# Patient Record
Sex: Female | Born: 1945 | Race: White | Hispanic: No | Marital: Married | Smoking: Never smoker
Health system: Southern US, Community
[De-identification: ages and names within clinical notes are randomized; demographics above are authoritative.]

## PROBLEM LIST (undated history)

## (undated) DIAGNOSIS — D126 Benign neoplasm of colon, unspecified: Secondary | ICD-10-CM

## (undated) DIAGNOSIS — M5136 Other intervertebral disc degeneration, lumbar region: Secondary | ICD-10-CM

## (undated) DIAGNOSIS — I10 Essential (primary) hypertension: Secondary | ICD-10-CM

## (undated) DIAGNOSIS — K219 Gastro-esophageal reflux disease without esophagitis: Secondary | ICD-10-CM

## (undated) DIAGNOSIS — R7303 Prediabetes: Secondary | ICD-10-CM

## (undated) DIAGNOSIS — M1990 Unspecified osteoarthritis, unspecified site: Secondary | ICD-10-CM

## (undated) DIAGNOSIS — M79605 Pain in left leg: Secondary | ICD-10-CM

## (undated) DIAGNOSIS — R519 Headache, unspecified: Secondary | ICD-10-CM

## (undated) DIAGNOSIS — E119 Type 2 diabetes mellitus without complications: Secondary | ICD-10-CM

## (undated) DIAGNOSIS — H3320 Serous retinal detachment, unspecified eye: Secondary | ICD-10-CM

## (undated) DIAGNOSIS — I2510 Atherosclerotic heart disease of native coronary artery without angina pectoris: Secondary | ICD-10-CM

## (undated) DIAGNOSIS — E785 Hyperlipidemia, unspecified: Secondary | ICD-10-CM

## (undated) DIAGNOSIS — F419 Anxiety disorder, unspecified: Secondary | ICD-10-CM

## (undated) DIAGNOSIS — F32A Depression, unspecified: Secondary | ICD-10-CM

## (undated) DIAGNOSIS — F329 Major depressive disorder, single episode, unspecified: Secondary | ICD-10-CM

## (undated) DIAGNOSIS — L4050 Arthropathic psoriasis, unspecified: Secondary | ICD-10-CM

## (undated) DIAGNOSIS — M51369 Other intervertebral disc degeneration, lumbar region without mention of lumbar back pain or lower extremity pain: Secondary | ICD-10-CM

## (undated) DIAGNOSIS — N9489 Other specified conditions associated with female genital organs and menstrual cycle: Secondary | ICD-10-CM

## (undated) HISTORY — PX: ABDOMINAL HYSTERECTOMY: SHX81

## (undated) HISTORY — PX: FLEXIBLE SIGMOIDOSCOPY: SHX1649

## (undated) HISTORY — DX: Hyperlipidemia, unspecified: E78.5

## (undated) HISTORY — PX: APPENDECTOMY: SHX54

## (undated) HISTORY — PX: LUMBAR LAMINECTOMY: SHX95

## (undated) HISTORY — PX: BILATERAL CARPAL TUNNEL RELEASE: SHX6508

## (undated) HISTORY — PX: TRIGGER FINGER RELEASE: SHX641

## (undated) HISTORY — PX: RETINAL DETACHMENT SURGERY: SHX105

## (undated) HISTORY — PX: ANGIOPLASTY: SHX39

## (undated) HISTORY — PX: CHOLECYSTECTOMY: SHX55

## (undated) HISTORY — DX: Gastro-esophageal reflux disease without esophagitis: K21.9

## (undated) HISTORY — PX: CATARACT EXTRACTION: SUR2

## (undated) HISTORY — PX: EYE SURGERY: SHX253

## (undated) HISTORY — DX: Essential (primary) hypertension: I10

## (undated) HISTORY — PX: BREAST BIOPSY: SHX20

## (undated) HISTORY — PX: COLONOSCOPY: SHX5424

## (undated) HISTORY — DX: Unspecified osteoarthritis, unspecified site: M19.90

---

## 2001-03-27 HISTORY — PX: JOINT REPLACEMENT: SHX530

## 2004-07-26 ENCOUNTER — Ambulatory Visit: Payer: Self-pay | Admitting: Internal Medicine

## 2005-07-12 ENCOUNTER — Other Ambulatory Visit: Payer: Self-pay

## 2005-07-20 ENCOUNTER — Ambulatory Visit: Payer: Self-pay | Admitting: Otolaryngology

## 2005-09-26 ENCOUNTER — Ambulatory Visit: Payer: Self-pay | Admitting: Internal Medicine

## 2005-11-15 ENCOUNTER — Ambulatory Visit: Payer: Self-pay | Admitting: Internal Medicine

## 2006-12-06 ENCOUNTER — Ambulatory Visit: Payer: Self-pay | Admitting: Internal Medicine

## 2007-04-16 ENCOUNTER — Ambulatory Visit: Payer: Self-pay | Admitting: Unknown Physician Specialty

## 2007-04-16 ENCOUNTER — Other Ambulatory Visit: Payer: Self-pay

## 2007-04-23 ENCOUNTER — Inpatient Hospital Stay: Payer: Self-pay | Admitting: Unknown Physician Specialty

## 2008-01-07 ENCOUNTER — Ambulatory Visit: Payer: Self-pay | Admitting: Internal Medicine

## 2009-01-19 ENCOUNTER — Ambulatory Visit: Payer: Self-pay | Admitting: Internal Medicine

## 2010-01-20 ENCOUNTER — Emergency Department: Payer: Self-pay | Admitting: Unknown Physician Specialty

## 2010-01-25 ENCOUNTER — Ambulatory Visit: Payer: Self-pay | Admitting: Internal Medicine

## 2010-01-28 ENCOUNTER — Ambulatory Visit: Payer: Self-pay | Admitting: Surgery

## 2010-02-07 ENCOUNTER — Ambulatory Visit: Payer: Self-pay | Admitting: Anesthesiology

## 2010-02-08 ENCOUNTER — Ambulatory Visit: Payer: Self-pay | Admitting: Surgery

## 2010-02-28 ENCOUNTER — Ambulatory Visit: Payer: Self-pay | Admitting: Internal Medicine

## 2011-01-30 ENCOUNTER — Ambulatory Visit: Payer: Self-pay | Admitting: Family Medicine

## 2011-05-12 ENCOUNTER — Ambulatory Visit: Payer: Self-pay | Admitting: Rheumatology

## 2011-06-06 ENCOUNTER — Ambulatory Visit: Payer: Self-pay | Admitting: Family Medicine

## 2012-04-10 ENCOUNTER — Ambulatory Visit: Payer: Self-pay | Admitting: Family Medicine

## 2013-03-27 HISTORY — PX: BACK SURGERY: SHX140

## 2013-05-13 ENCOUNTER — Ambulatory Visit: Payer: Self-pay | Admitting: Family Medicine

## 2013-06-03 ENCOUNTER — Ambulatory Visit: Payer: Self-pay | Admitting: Family Medicine

## 2014-01-09 ENCOUNTER — Ambulatory Visit: Payer: Self-pay | Admitting: Physical Medicine and Rehabilitation

## 2014-06-11 ENCOUNTER — Ambulatory Visit: Payer: Self-pay | Admitting: Family Medicine

## 2014-06-17 ENCOUNTER — Ambulatory Visit: Payer: Self-pay | Admitting: Family Medicine

## 2015-06-21 ENCOUNTER — Other Ambulatory Visit: Payer: Self-pay | Admitting: Family Medicine

## 2015-06-21 DIAGNOSIS — Z1231 Encounter for screening mammogram for malignant neoplasm of breast: Secondary | ICD-10-CM

## 2015-06-29 ENCOUNTER — Ambulatory Visit
Admission: RE | Admit: 2015-06-29 | Discharge: 2015-06-29 | Disposition: A | Discharge status: Home / Self Care | Payer: Medicare Other | Source: Ambulatory Visit | Attending: Family Medicine | Admitting: Family Medicine

## 2015-06-29 ENCOUNTER — Other Ambulatory Visit: Payer: Self-pay | Admitting: Family Medicine

## 2015-06-29 DIAGNOSIS — Z1231 Encounter for screening mammogram for malignant neoplasm of breast: Secondary | ICD-10-CM

## 2016-05-24 ENCOUNTER — Ambulatory Visit: Payer: Medicare Other | Admitting: *Deleted

## 2016-05-29 ENCOUNTER — Other Ambulatory Visit: Payer: Self-pay | Admitting: Family Medicine

## 2016-05-29 DIAGNOSIS — Z1231 Encounter for screening mammogram for malignant neoplasm of breast: Secondary | ICD-10-CM

## 2016-06-01 ENCOUNTER — Encounter: Payer: Self-pay | Admitting: *Deleted

## 2016-06-01 ENCOUNTER — Encounter: Payer: Medicare Other | Attending: Family Medicine | Admitting: *Deleted

## 2016-06-01 VITALS — BP 126/76 | Ht 64.0 in | Wt 165.6 lb

## 2016-06-01 DIAGNOSIS — E119 Type 2 diabetes mellitus without complications: Secondary | ICD-10-CM | POA: Insufficient documentation

## 2016-06-01 DIAGNOSIS — Z713 Dietary counseling and surveillance: Secondary | ICD-10-CM | POA: Insufficient documentation

## 2016-06-01 NOTE — Progress Notes (Signed)
Diabetes Self-Management Education  Visit Type: First/Initial  Appt. Start Time: 1325 Appt. End Time: 4401  06/01/2016  [name], identified by name and date of birth, is a 71 y.o. female with a diagnosis of Diabetes: Type 2.   ASSESSMENT  Blood pressure 126/76, height 5\' 4"  (1.626 m), weight 165 lb 9.6 oz (75.1 kg). Body mass index is 28.43 kg/m.      Diabetes Self-Management Education - 06/01/16 1525      Visit Information   Visit Type First/Initial     Initial Visit   Diabetes Type Type 2   Are you currently following a meal plan? No   Are you taking your medications as prescribed? Yes   Date Diagnosed Jan 2018     Health Coping   How would you rate your overall health? Good     Psychosocial Assessment   Patient Belief/Attitude about Diabetes Motivated to manage diabetes   Self-care barriers None   Self-management support Doctor's office;Family   Patient Concerns Nutrition/Meal planning;Glycemic Control;Weight Control;Healthy Lifestyle   Special Needs None   Preferred Learning Style Visual;Hands on   Learning Readiness Ready   How often do you need to have someone help you when you read instructions, pamphlets, or other written materials from your doctor or pharmacy? 1 - Never   What is the last grade level you completed in school? 12th     Pre-Education Assessment   Patient understands the diabetes disease and treatment process. Needs Instruction   Patient understands incorporating nutritional management into lifestyle. Needs Instruction   Patient undertands incorporating physical activity into lifestyle. Needs Instruction   Patient understands using medications safely. Needs Instruction   Patient understands monitoring blood glucose, interpreting and using results Needs Instruction   Patient understands prevention, detection, and treatment of acute complications. Needs Instruction   Patient understands prevention, detection, and treatment of chronic  complications. Needs Instruction   Patient understands how to develop strategies to address psychosocial issues. Needs Instruction   Patient understands how to develop strategies to promote health/change behavior. Needs Instruction     Complications   Last HgB A1C per patient/outside source 6.5 %  04/26/16   How often do you check your blood sugar? 0 times/day (not testing)  Pt brought her meter to appt. Instructed on GE 100 glucometer. BG upon return demonstration was 106 mg/dL at 2:30 pm - 2 hrs pp.    Have you had a dilated eye exam in the past 12 months? Yes   Have you had a dental exam in the past 12 months? Yes   Are you checking your feet? No     Dietary Intake   Breakfast cereal and milk; toast and butter   Lunch bologna sandwich or tomato sandwich with chips   Snack (afternoon) cheese, grapes   Dinner meat, potatoes, peas, corn, beans, cabbage, greens   Beverage(s) water, unsweetened tea, diet soda     Exercise   Exercise Type ADL's     Patient Education   Previous Diabetes Education No   Disease state  Definition of diabetes, type 1 and 2, and the diagnosis of diabetes   Nutrition management  Role of diet in the treatment of diabetes and the relationship between the three main macronutrients and blood glucose level;Food label reading, portion sizes and measuring food.   Physical activity and exercise  Role of exercise on diabetes management, blood pressure control and cardiac health.   Monitoring Taught/evaluated SMBG meter.;Purpose and frequency of SMBG.;Taught/discussed recording  of test results and interpretation of SMBG.;Identified appropriate SMBG and/or A1C goals.   Chronic complications Relationship between chronic complications and blood glucose control   Psychosocial adjustment Identified and addressed patients feelings and concerns about diabetes     Individualized Goals (developed by patient)   Reducing Risk Improve blood sugars Lose weight Lead a healthier  lifestyle Become more fit     Outcomes   Expected Outcomes Demonstrated interest in learning. Expect positive outcomes   Future DMSE 2 wks      Individualized Plan for Diabetes Self-Management Training:   Learning Objective:  Patient will have a greater understanding of diabetes self-management. Patient education plan is to attend individual and/or group sessions per assessed needs and concerns.   Plan:   Patient Instructions  Check blood sugars 2 x day before breakfast and 2 hrs after one meal 3-4 x week Exercise: Begin walking for 15  minutes  3  days a week and gradually increase to 30 minutes 5 x week Eat 3 meals day,  1-2 snacks a day Space meals 4-6 hours apart Limit snack foods (chips) Bring blood sugar records to the next class   Expected Outcomes:  Demonstrated interest in learning. Expect positive outcomes  Education material provided:  General Meal Planning Guidelines Simple Meal Plan  If problems or questions, patient to contact team via:  Tina Drilling, RN, [place], CDE [phone]  Future DSME appointment: 2 wks  June 22, 2016 for Diabetes Class 1

## 2016-06-01 NOTE — Patient Instructions (Signed)
Check blood sugars 2 x day before breakfast and 2 hrs after one meal 3-4 x week  Exercise: Begin walking for 15  minutes  3  days a week and gradually increase to 30 minutes 5 x week  Eat 3 meals day,  1-2 snacks a day Space meals 4-6 hours apart Limit snack foods (chips)  Bring blood sugar records to the next class

## 2016-06-22 ENCOUNTER — Encounter: Payer: Medicare Other | Admitting: Dietician

## 2016-06-22 ENCOUNTER — Encounter: Payer: Self-pay | Admitting: Dietician

## 2016-06-22 VITALS — Wt 166.1 lb

## 2016-06-22 DIAGNOSIS — Z713 Dietary counseling and surveillance: Secondary | ICD-10-CM | POA: Diagnosis not present

## 2016-06-22 DIAGNOSIS — E119 Type 2 diabetes mellitus without complications: Secondary | ICD-10-CM

## 2016-06-22 NOTE — Progress Notes (Signed)

## 2016-06-29 ENCOUNTER — Other Ambulatory Visit: Payer: Self-pay | Admitting: Family Medicine

## 2016-06-29 DIAGNOSIS — M25511 Pain in right shoulder: Secondary | ICD-10-CM

## 2016-07-03 ENCOUNTER — Telehealth: Payer: Self-pay | Admitting: *Deleted

## 2016-07-03 NOTE — Telephone Encounter (Signed)
Received voice mail from patient. She needs to ask about diabetes class schedule since she missed last week's Class 2. Called patient and she reports that she saw the doctor for pain in her legs and was given Prednisone. She also had xrays taken. If pain still present after steroids she may see an orthopedic. She will come to Class 3 this Thurs and reschedule Class 2 at this time.

## 2016-07-04 ENCOUNTER — Ambulatory Visit: Payer: Medicare Other

## 2016-07-12 ENCOUNTER — Ambulatory Visit: Payer: Medicare Other

## 2016-07-14 ENCOUNTER — Ambulatory Visit
Admission: RE | Admit: 2016-07-14 | Discharge: 2016-07-14 | Disposition: A | Discharge status: Home / Self Care | Payer: Medicare Other | Source: Ambulatory Visit | Attending: Family Medicine | Admitting: Family Medicine

## 2016-07-14 DIAGNOSIS — S46811A Strain of other muscles, fascia and tendons at shoulder and upper arm level, right arm, initial encounter: Secondary | ICD-10-CM | POA: Diagnosis not present

## 2016-07-14 DIAGNOSIS — M19011 Primary osteoarthritis, right shoulder: Secondary | ICD-10-CM | POA: Diagnosis not present

## 2016-07-14 DIAGNOSIS — M75111 Incomplete rotator cuff tear or rupture of right shoulder, not specified as traumatic: Secondary | ICD-10-CM | POA: Insufficient documentation

## 2016-07-14 DIAGNOSIS — M25511 Pain in right shoulder: Secondary | ICD-10-CM | POA: Insufficient documentation

## 2016-07-14 DIAGNOSIS — X58XXXA Exposure to other specified factors, initial encounter: Secondary | ICD-10-CM | POA: Insufficient documentation

## 2016-07-24 ENCOUNTER — Ambulatory Visit
Admission: RE | Admit: 2016-07-24 | Discharge: 2016-07-24 | Disposition: A | Discharge status: Home / Self Care | Payer: Medicare Other | Source: Ambulatory Visit | Attending: Family Medicine | Admitting: Family Medicine

## 2016-07-24 DIAGNOSIS — Z1231 Encounter for screening mammogram for malignant neoplasm of breast: Secondary | ICD-10-CM

## 2016-08-02 ENCOUNTER — Other Ambulatory Visit: Payer: Self-pay | Admitting: Physician Assistant

## 2016-08-02 DIAGNOSIS — M4326 Fusion of spine, lumbar region: Secondary | ICD-10-CM

## 2016-08-02 DIAGNOSIS — M79605 Pain in left leg: Secondary | ICD-10-CM

## 2016-08-02 DIAGNOSIS — R202 Paresthesia of skin: Secondary | ICD-10-CM

## 2016-08-03 ENCOUNTER — Encounter: Payer: Self-pay | Admitting: *Deleted

## 2016-08-07 ENCOUNTER — Ambulatory Visit
Admission: RE | Admit: 2016-08-07 | Discharge: 2016-08-07 | Disposition: A | Discharge status: Home / Self Care | Payer: Medicare Other | Source: Ambulatory Visit | Attending: Physician Assistant | Admitting: Physician Assistant

## 2016-08-07 DIAGNOSIS — M4326 Fusion of spine, lumbar region: Secondary | ICD-10-CM

## 2016-08-07 DIAGNOSIS — M5126 Other intervertebral disc displacement, lumbar region: Secondary | ICD-10-CM | POA: Diagnosis not present

## 2016-08-07 DIAGNOSIS — M79605 Pain in left leg: Secondary | ICD-10-CM | POA: Diagnosis present

## 2016-08-07 DIAGNOSIS — R202 Paresthesia of skin: Secondary | ICD-10-CM

## 2016-08-07 DIAGNOSIS — M48061 Spinal stenosis, lumbar region without neurogenic claudication: Secondary | ICD-10-CM | POA: Diagnosis not present

## 2016-09-12 ENCOUNTER — Encounter
Admission: RE | Admit: 2016-09-12 | Discharge: 2016-09-12 | Disposition: A | Discharge status: Home / Self Care | Payer: Medicare Other | Source: Ambulatory Visit | Attending: Surgery | Admitting: Surgery

## 2016-09-12 DIAGNOSIS — Z01810 Encounter for preprocedural cardiovascular examination: Secondary | ICD-10-CM | POA: Insufficient documentation

## 2016-09-12 DIAGNOSIS — I10 Essential (primary) hypertension: Secondary | ICD-10-CM | POA: Diagnosis present

## 2016-09-12 DIAGNOSIS — Z01812 Encounter for preprocedural laboratory examination: Secondary | ICD-10-CM | POA: Diagnosis present

## 2016-09-12 HISTORY — DX: Anxiety disorder, unspecified: F41.9

## 2016-09-12 HISTORY — DX: Prediabetes: R73.03

## 2016-09-12 HISTORY — DX: Major depressive disorder, single episode, unspecified: F32.9

## 2016-09-12 HISTORY — DX: Depression, unspecified: F32.A

## 2016-09-12 HISTORY — DX: Unspecified osteoarthritis, unspecified site: M19.90

## 2016-09-12 HISTORY — DX: Other intervertebral disc degeneration, lumbar region without mention of lumbar back pain or lower extremity pain: M51.369

## 2016-09-12 HISTORY — DX: Other intervertebral disc degeneration, lumbar region: M51.36

## 2016-09-12 LAB — CBC
HCT: 38.1 % (ref 35.0–47.0)
Hemoglobin: 13.2 g/dL (ref 12.0–16.0)
MCH: 29.9 pg (ref 26.0–34.0)
MCHC: 34.6 g/dL (ref 32.0–36.0)
MCV: 86.5 fL (ref 80.0–100.0)
PLATELETS: 309 10*3/uL (ref 150–440)
RBC: 4.41 MIL/uL (ref 3.80–5.20)
RDW: 13.5 % (ref 11.5–14.5)
WBC: 6.1 10*3/uL (ref 3.6–11.0)

## 2016-09-12 NOTE — Patient Instructions (Signed)
  Your procedure is scheduled on: September 19, 2016 [facility]) Report to Same Day Surgery 2nd floor medical mall ([place] Entrance-take elevator on left to 2nd floor.  Check in with surgery information desk.) To find out your arrival time please call [phone] between 1PM - 3PM on September 18, 2016 (MONDAY)   Remember: Instructions that are not followed completely may result in serious medical risk, up to and including death, or upon the discretion of your surgeon and anesthesiologist your surgery may need to be rescheduled.    _x___ 1. Do not eat food or drink liquids after midnight. No gum chewing or  hard candies                              __x__ 2. No Alcohol for 24 hours before or after surgery.   __x__3. No Smoking for 24 prior to surgery.   ____  4. Bring all medications with you on the day of surgery if instructed.    __x__ 5. Notify your doctor if there is any change in your medical condition     (cold, fever, infections).     Do not wear jewelry, make-up, hairpins, clips or nail polish.  Do not wear lotions, powders, or perfumes.  Do not shave 48 hours prior to surgery. Men may shave face and neck.  Do not bring valuables to the hospital.    [facility] is not responsible for any belongings or valuables.               Contacts, dentures or bridgework may not be worn into surgery.  Leave your suitcase in the car. After surgery it may be brought to your room.  For patients admitted to the hospital, discharge time is determined by your  treatment team                       Patients discharged the day of surgery will not be allowed to drive home.  You will need someone to drive you home and stay with you the night of your procedure.    Please read over the following fact sheets that you were given:   [facility] Preparing for Surgery and or MRSA Information   _x___ Take the following medications the morning of surgery with a sip of water :  1. ATORVASTATIN  2.  GABAPENTIN  3. CYMBALTA  4. AMLODIPINE  5. OMEPRAZOLE (OMEPRAZOLE AT BEDTIME ON MONDAY NIGHT PRIOR TO SURGERY )  6.  ____Fleets enema or Magnesium Citrate as directed.   _x___ Use CHG Soap or sage wipes as directed on instruction sheet   ____ Use inhalers on the day of surgery and bring to hospital day of surgery  ____ Stop Metformin and Janumet 2 days prior to surgery.    ____ Take 1/2 of usual insulin dose the night before surgery and none on the morning surgery     _x___ Follow recommendations from Cardiologist, Pulmonologist or PCP regarding          stopping Aspirin, Coumadin, Pllavix ,Eliquis, Effient, or Pradaxa, and Pletal.  X____Stop Anti-inflammatories such as Advil, Aleve, Ibuprofen, Motrin, Naproxen, Naprosyn, Goodies powders or aspirin products. (STOP MELOXICAM NOW )  OK to take Tylenol     _x___ Stop supplements until after surgery.  But may continue Vitamin D, Vitamin B,  and multivitamin   ____ Bring C-Pap to the hospital.

## 2016-09-18 MED ORDER — CEFAZOLIN SODIUM-DEXTROSE 2-4 GM/100ML-% IV SOLN
2.0000 g | Freq: Once | INTRAVENOUS | Status: AC
  Administered 2016-09-19: 2 g via INTRAVENOUS

## 2016-09-19 ENCOUNTER — Encounter
Admission: RE | Disposition: A | Discharge status: Home / Self Care | Payer: Self-pay | Source: Ambulatory Visit | Attending: Surgery

## 2016-09-19 ENCOUNTER — Ambulatory Visit: Payer: Medicare Other | Admitting: Anesthesiology

## 2016-09-19 ENCOUNTER — Ambulatory Visit
Admission: RE | Admit: 2016-09-19 | Discharge: 2016-09-19 | Disposition: A | Discharge status: Home / Self Care | Payer: Medicare Other | Source: Ambulatory Visit | Attending: Surgery | Admitting: Surgery

## 2016-09-19 ENCOUNTER — Encounter: Payer: Self-pay | Admitting: *Deleted

## 2016-09-19 DIAGNOSIS — K219 Gastro-esophageal reflux disease without esophagitis: Secondary | ICD-10-CM | POA: Insufficient documentation

## 2016-09-19 DIAGNOSIS — M75101 Unspecified rotator cuff tear or rupture of right shoulder, not specified as traumatic: Secondary | ICD-10-CM | POA: Diagnosis present

## 2016-09-19 DIAGNOSIS — R7303 Prediabetes: Secondary | ICD-10-CM | POA: Insufficient documentation

## 2016-09-19 DIAGNOSIS — M5136 Other intervertebral disc degeneration, lumbar region: Secondary | ICD-10-CM | POA: Diagnosis not present

## 2016-09-19 DIAGNOSIS — I10 Essential (primary) hypertension: Secondary | ICD-10-CM | POA: Diagnosis not present

## 2016-09-19 DIAGNOSIS — F329 Major depressive disorder, single episode, unspecified: Secondary | ICD-10-CM | POA: Diagnosis not present

## 2016-09-19 DIAGNOSIS — Z79899 Other long term (current) drug therapy: Secondary | ICD-10-CM | POA: Diagnosis not present

## 2016-09-19 DIAGNOSIS — F419 Anxiety disorder, unspecified: Secondary | ICD-10-CM | POA: Diagnosis not present

## 2016-09-19 DIAGNOSIS — M1990 Unspecified osteoarthritis, unspecified site: Secondary | ICD-10-CM | POA: Diagnosis not present

## 2016-09-19 DIAGNOSIS — E785 Hyperlipidemia, unspecified: Secondary | ICD-10-CM | POA: Insufficient documentation

## 2016-09-19 HISTORY — PX: SHOULDER ARTHROSCOPY WITH OPEN ROTATOR CUFF REPAIR: SHX6092

## 2016-09-19 LAB — GLUCOSE, CAPILLARY
GLUCOSE-CAPILLARY: 114 mg/dL — AB (ref 65–99)
Glucose-Capillary: 112 mg/dL — ABNORMAL HIGH (ref 65–99)

## 2016-09-19 SURGERY — ARTHROSCOPY, SHOULDER WITH REPAIR, ROTATOR CUFF, OPEN
Anesthesia: General | Site: Shoulder | Laterality: Right | Wound class: Clean

## 2016-09-19 MED ORDER — PROPOFOL 10 MG/ML IV BOLUS
INTRAVENOUS | Status: AC
  Filled 2016-09-19: qty 20

## 2016-09-19 MED ORDER — ROPIVACAINE HCL 5 MG/ML IJ SOLN
INTRAMUSCULAR | Status: DC | PRN
  Administered 2016-09-19: 30 mL via PERINEURAL

## 2016-09-19 MED ORDER — SUGAMMADEX SODIUM 200 MG/2ML IV SOLN
INTRAVENOUS | Status: DC | PRN
  Administered 2016-09-19: 200 mg via INTRAVENOUS

## 2016-09-19 MED ORDER — FENTANYL CITRATE (PF) 100 MCG/2ML IJ SOLN
INTRAMUSCULAR | Status: DC | PRN
  Administered 2016-09-19: 100 ug via INTRAVENOUS

## 2016-09-19 MED ORDER — ROCURONIUM BROMIDE 100 MG/10ML IV SOLN
INTRAVENOUS | Status: DC | PRN
  Administered 2016-09-19: 50 mg via INTRAVENOUS

## 2016-09-19 MED ORDER — MIDAZOLAM HCL 2 MG/2ML IJ SOLN
2.0000 mg | Freq: Once | INTRAMUSCULAR | Status: AC
  Administered 2016-09-19: 2 mg via INTRAVENOUS

## 2016-09-19 MED ORDER — METOCLOPRAMIDE HCL 10 MG PO TABS: 5.0000 mg | ORAL_TABLET | Freq: Three times a day (TID) | ORAL | Status: DC | PRN

## 2016-09-19 MED ORDER — HYDROCODONE-ACETAMINOPHEN 5-325 MG PO TABS: 1.0000 | ORAL_TABLET | Freq: Four times a day (QID) | ORAL | 0 refills | Status: DC | PRN

## 2016-09-19 MED ORDER — POTASSIUM CHLORIDE IN NACL 20-0.9 MEQ/L-% IV SOLN: INTRAVENOUS | Status: DC

## 2016-09-19 MED ORDER — DEXAMETHASONE SODIUM PHOSPHATE 4 MG/ML IJ SOLN
INTRAMUSCULAR | Status: DC | PRN
  Administered 2016-09-19: 6 mg via INTRAVENOUS

## 2016-09-19 MED ORDER — OXYCODONE HCL 5 MG/5ML PO SOLN: 5.0000 mg | Freq: Once | ORAL | Status: DC | PRN

## 2016-09-19 MED ORDER — OXYCODONE HCL 5 MG PO TABS: 5.0000 mg | ORAL_TABLET | Freq: Once | ORAL | Status: DC | PRN

## 2016-09-19 MED ORDER — MEPERIDINE HCL 50 MG/ML IJ SOLN: 6.2500 mg | INTRAMUSCULAR | Status: DC | PRN

## 2016-09-19 MED ORDER — ROPIVACAINE HCL 5 MG/ML IJ SOLN
INTRAMUSCULAR | Status: AC
  Filled 2016-09-19: qty 30

## 2016-09-19 MED ORDER — EPINEPHRINE 30 MG/30ML IJ SOLN
INTRAMUSCULAR | Status: AC
  Filled 2016-09-19: qty 1

## 2016-09-19 MED ORDER — FENTANYL CITRATE (PF) 100 MCG/2ML IJ SOLN
INTRAMUSCULAR | Status: AC
  Filled 2016-09-19: qty 2

## 2016-09-19 MED ORDER — LACTATED RINGERS IV SOLN
INTRAVENOUS | Status: DC | PRN
  Administered 2016-09-19: 2 mL

## 2016-09-19 MED ORDER — LIDOCAINE HCL (PF) 1 % IJ SOLN
INTRAMUSCULAR | Status: AC
  Filled 2016-09-19: qty 5

## 2016-09-19 MED ORDER — LIDOCAINE HCL (PF) 1 % IJ SOLN
INTRAMUSCULAR | Status: DC | PRN
  Administered 2016-09-19: 3 mL

## 2016-09-19 MED ORDER — BUPIVACAINE-EPINEPHRINE (PF) 0.5% -1:200000 IJ SOLN
INTRAMUSCULAR | Status: AC
  Filled 2016-09-19: qty 30

## 2016-09-19 MED ORDER — METOCLOPRAMIDE HCL 5 MG/ML IJ SOLN
5.0000 mg | Freq: Three times a day (TID) | INTRAMUSCULAR | Status: DC | PRN
Start: 2016-09-19 — End: 2016-09-19

## 2016-09-19 MED ORDER — CEFAZOLIN SODIUM-DEXTROSE 2-4 GM/100ML-% IV SOLN
INTRAVENOUS | Status: AC
  Filled 2016-09-19: qty 100

## 2016-09-19 MED ORDER — SODIUM CHLORIDE 0.9 % IV SOLN
INTRAVENOUS | Status: DC
  Administered 2016-09-19: 12:00:00 via INTRAVENOUS

## 2016-09-19 MED ORDER — PROPOFOL 10 MG/ML IV BOLUS
INTRAVENOUS | Status: DC | PRN
  Administered 2016-09-19: 130 mg via INTRAVENOUS
  Administered 2016-09-19: 25 mg via INTRAVENOUS

## 2016-09-19 MED ORDER — LIDOCAINE HCL (PF) 2 % IJ SOLN
INTRAMUSCULAR | Status: AC
  Filled 2016-09-19: qty 2

## 2016-09-19 MED ORDER — BUPIVACAINE-EPINEPHRINE 0.5% -1:200000 IJ SOLN
INTRAMUSCULAR | Status: DC | PRN
  Administered 2016-09-19: 30 mL

## 2016-09-19 MED ORDER — ROCURONIUM BROMIDE 50 MG/5ML IV SOLN
INTRAVENOUS | Status: AC
  Filled 2016-09-19: qty 1

## 2016-09-19 MED ORDER — ONDANSETRON HCL 4 MG/2ML IJ SOLN: 4.0000 mg | Freq: Four times a day (QID) | INTRAMUSCULAR | Status: DC | PRN

## 2016-09-19 MED ORDER — FENTANYL CITRATE (PF) 100 MCG/2ML IJ SOLN: 25.0000 ug | INTRAMUSCULAR | Status: DC | PRN

## 2016-09-19 MED ORDER — ONDANSETRON HCL 4 MG PO TABS: 4.0000 mg | ORAL_TABLET | Freq: Four times a day (QID) | ORAL | Status: DC | PRN

## 2016-09-19 MED ORDER — MIDAZOLAM HCL 2 MG/2ML IJ SOLN
INTRAMUSCULAR | Status: AC
  Administered 2016-09-19: 2 mg via INTRAVENOUS
  Filled 2016-09-19: qty 2

## 2016-09-19 MED ORDER — LIDOCAINE HCL (CARDIAC) 20 MG/ML IV SOLN
INTRAVENOUS | Status: DC | PRN
  Administered 2016-09-19: 100 mg via INTRAVENOUS

## 2016-09-19 MED ORDER — PROMETHAZINE HCL 25 MG/ML IJ SOLN: 6.2500 mg | INTRAMUSCULAR | Status: DC | PRN

## 2016-09-19 MED ORDER — ONDANSETRON HCL 4 MG/2ML IJ SOLN
INTRAMUSCULAR | Status: AC
  Filled 2016-09-19: qty 2

## 2016-09-19 MED ORDER — ONDANSETRON HCL 4 MG/2ML IJ SOLN
INTRAMUSCULAR | Status: DC | PRN
  Administered 2016-09-19: 4 mg via INTRAVENOUS

## 2016-09-19 MED ORDER — HYDROCODONE-ACETAMINOPHEN 5-325 MG PO TABS: 1.0000 | ORAL_TABLET | ORAL | Status: DC | PRN

## 2016-09-19 SURGICAL SUPPLY — 46 items
ANCHOR JUGGERKNOT WTAP NDL 2.9 (Anchor) ×6 IMPLANT
ANCHOR SUT QUATTRO KNTLS 4.5 (Anchor) ×3 IMPLANT
BIT DRILL JUGRKNT W/NDL BIT2.9 (DRILL) ×1 IMPLANT
BLADE FULL RADIUS 3.5 (BLADE) ×3 IMPLANT
BUR ACROMIONIZER 4.0 (BURR) ×3 IMPLANT
CANNULA SHAVER 8MMX76MM (CANNULA) ×3 IMPLANT
CHLORAPREP W/TINT 26ML (MISCELLANEOUS) ×3 IMPLANT
COVER MAYO STAND STRL (DRAPES) ×3 IMPLANT
DRAPE IMP U-DRAPE 54X76 (DRAPES) ×6 IMPLANT
DRILL JUGGERKNOT W/NDL BIT 2.9 (DRILL) ×3
DRSG OPSITE POSTOP 4X8 (GAUZE/BANDAGES/DRESSINGS) ×3 IMPLANT
ELECT REM PT RETURN 9FT ADLT (ELECTROSURGICAL) ×3
ELECTRODE REM PT RTRN 9FT ADLT (ELECTROSURGICAL) ×1 IMPLANT
GAUZE PETRO XEROFOAM 1X8 (MISCELLANEOUS) ×3 IMPLANT
GAUZE SPONGE 4X4 12PLY STRL (GAUZE/BANDAGES/DRESSINGS) ×3 IMPLANT
GLOVE BIO SURGEON STRL SZ7.5 (GLOVE) ×6 IMPLANT
GLOVE BIO SURGEON STRL SZ8 (GLOVE) ×6 IMPLANT
GLOVE BIOGEL PI IND STRL 8 (GLOVE) ×1 IMPLANT
GLOVE BIOGEL PI INDICATOR 8 (GLOVE) ×2
GLOVE INDICATOR 8.0 STRL GRN (GLOVE) ×3 IMPLANT
GOWN STRL REUS W/ TWL LRG LVL3 (GOWN DISPOSABLE) ×1 IMPLANT
GOWN STRL REUS W/ TWL XL LVL3 (GOWN DISPOSABLE) ×1 IMPLANT
GOWN STRL REUS W/TWL LRG LVL3 (GOWN DISPOSABLE) ×2
GOWN STRL REUS W/TWL XL LVL3 (GOWN DISPOSABLE) ×2
GRASPER SUT 15 45D LOW PRO (SUTURE) IMPLANT
IV LACTATED RINGER IRRG 3000ML (IV SOLUTION) ×4
IV LR IRRIG 3000ML ARTHROMATIC (IV SOLUTION) ×2 IMPLANT
MANIFOLD NEPTUNE II (INSTRUMENTS) ×3 IMPLANT
MASK FACE SPIDER DISP (MASK) ×3 IMPLANT
MAT BLUE FLOOR 46X72 FLO (MISCELLANEOUS) ×3 IMPLANT
NDL MAYO CATGUT SZ5 (NEEDLE)
NDL SUT 5 .5 CRC TPR PNT MAYO (NEEDLE) IMPLANT
NEEDLE REVERSE CUT 1/2 CRC (NEEDLE) IMPLANT
PACK ARTHROSCOPY SHOULDER (MISCELLANEOUS) ×3 IMPLANT
SLING ARM LRG DEEP (SOFTGOODS) IMPLANT
SLING ULTRA II LG (MISCELLANEOUS) ×3 IMPLANT
STAPLER SKIN PROX 35W (STAPLE) ×3 IMPLANT
STRAP SAFETY BODY (MISCELLANEOUS) ×3 IMPLANT
SUT ETHIBOND 0 MO6 C/R (SUTURE) ×3 IMPLANT
SUT VIC AB 2-0 CT1 27 (SUTURE) ×4
SUT VIC AB 2-0 CT1 TAPERPNT 27 (SUTURE) ×2 IMPLANT
TAPE MICROFOAM 4IN (TAPE) ×3 IMPLANT
TUBING ARTHRO INFLOW-ONLY STRL (TUBING) ×3 IMPLANT
TUBING CONNECTING 10 (TUBING) ×2 IMPLANT
TUBING CONNECTING 10' (TUBING) ×1
WAND HAND CNTRL MULTIVAC 90 (MISCELLANEOUS) ×3 IMPLANT

## 2016-09-19 NOTE — Op Note (Signed)
09/19/2016  2:10 PM  Patient:   Tina Hubbard  Pre-Op Diagnosis:   Impingement/tendinopathy with rotator cuff tear and biceps tendinopathy, right shoulder.  Post-Op Diagnosis: Impingement/tendinopathy with rotator cuff tear, degenerative labral fraying, early degenerative joint disease, and biceps tendinopathy, right shoulder.  Procedure: Extensive arthroscopic debridement with biceps tenolysis, arthroscopic subacromial decompression, and mini-open rotator cuff repair, right shoulder.  Anesthesia: General endotracheal with interscalene block placed preoperatively by the anesthesiologist.  Surgeon:   Pascal Lux, MD  Assistant:   Kandee Keen, PA-C  Findings: As above. There was extensive labral fraying anteriorly and superiorly and posteriorly, as well as diffuse grade 2 chondromalacial changes involving the glenoid and humeral head. The biceps tendon demonstrated significant tendinopathic changes. There was a full-thickness tear involving the entire infraspinatus tendon. The remainder of the rotator cuff was satisfactory condition.  Complications: None  Fluids:   650 cc  Estimated blood loss: 10 cc  Tourniquet time: None  Drains: None  Closure: Staples   Brief clinical note: The patient is a 71 year old female with a history of progressively worsening right shoulder pain. The patient's symptoms have progressed despite medications, activity modification, etc. The patient's history and examination are consistent with impingement/tendinopathy with a rotator cuff tear. These findings were confirmed by MRI scan. The patient presents at this time for definitive management of these shoulder symptoms.  Procedure: The patient underwent placement of an interscalene block by the anesthesiologist in the preoperative holding area before being brought into the operating room and lain in the supine position. The patient then underwent general endotracheal  intubation and anesthesia before being repositioned in the beach chair position using the beach chair positioner. The right shoulder and upper extremity were prepped with ChloraPrep solution before being draped sterilely. Preoperative antibiotics were administered. A timeout was performed to confirm the proper surgical site before the expected portal sites and incision site were injected with 0.5% Sensorcaine with epinephrine. A posterior portal was created and the glenohumeral joint thoroughly inspected with the findings as described above. An anterior portal was created using an outside-in technique. The labrum and rotator cuff were further probed, again confirming the above-noted findings. Areas of extensive synovitis anteriorly and superiorly were debrided back to stable margins, as were the areas of labral fraying involving the anterior, superior, and posterior portions of the labrum. The ArthroCare wand was inserted and used to release the biceps tendon from its labral anchor, as well as to obtain hemostasis and to "anneal" the labrum superiorly, anteriorly, and posteriorly. The instruments were removed from the joint after suctioning the excess fluid.  The camera was repositioned through the posterior portal into the subacromial space. A separate lateral portal was created using an outside-in technique. The 3.5 mm full-radius resector was introduced and used to perform a subtotal bursectomy. The ArthroCare wand was then inserted and used to remove the periosteal tissue off the undersurface of the anterior third of the acromion as well as to recess the coracoacromial ligament from its attachment along the anterior and lateral margins of the acromion. The 4.0 mm acromionizing bur was introduced and used to complete the decompression by removing the undersurface of the anterior third of the acromion. The full radius resector was reintroduced to remove any residual bony debris before the ArthroCare wand was  reintroduced to obtain hemostasis. The instruments were then removed from the subacromial space after suctioning the excess fluid.  An approximately 4-5 cm incision was made over the anterolateral aspect of the shoulder beginning  at the anterolateral corner of the acromion and extending distally in line with the bicipital groove. This incision was carried down through the subcutaneous tissues to expose the deltoid fascia. The raphae between the anterior and middle thirds was identified and this plane developed to provide access into the subacromial space. Additional bursal tissues were debrided sharply using Metzenbaum scissors. The rotator cuff tear was readily identified. The margins were debrided sharply with a #15 blade and the exposed greater tuberosity roughened with a rongeur. The tear was repaired using two Biomet 2.9 mm JuggerKnot anchors. These sutures were then brought back laterally and secured using a [employer] anchor to create a two-layer closure. An apparent watertight closure was obtained. It was elected not to perform a biceps tenodesis.   The wound was copiously irrigated with sterile saline solution before the deltoid raphae was reapproximated using 2-0 Vicryl interrupted sutures. The subcutaneous tissues were closed in two layers using 2-0 Vicryl interrupted sutures before the skin was closed using staples. The portal sites also were closed using staples. A sterile bulky dressing was applied to the shoulder before the arm was placed into a shoulder immobilizer. The patient was then awakened, extubated, and returned to the recovery room in satisfactory condition after tolerating the procedure well.

## 2016-09-19 NOTE — Anesthesia Post-op Follow-up Note (Cosign Needed)
Anesthesia QCDR form completed.        

## 2016-09-19 NOTE — Anesthesia Procedure Notes (Signed)
Procedure Name: Intubation Date/Time: 09/19/2016 12:43 PM Performed by: Rosaria Ferries, Travas Schexnayder Pre-anesthesia Checklist: Patient identified, Emergency Drugs available, Suction available and Patient being monitored Patient Re-evaluated:Patient Re-evaluated prior to inductionOxygen Delivery Method: Circle system utilized Preoxygenation: Pre-oxygenation with 100% oxygen Intubation Type: IV induction Laryngoscope Size: Mac and 3 Grade View: Grade II Tube size: 7.0 mm Number of attempts: 2 Airway Equipment and Method: Bougie stylet Placement Confirmation: ETT inserted through vocal cords under direct vision,  positive ETCO2 and breath sounds checked- equal and bilateral Secured at: 22 cm Tube secured with: Tape Dental Injury: Teeth and Oropharynx as per pre-operative assessment  Difficulty Due To: Difficult Airway- due to anterior larynx Future Recommendations: Recommend- induction with short-acting agent, and alternative techniques readily available Comments: Small mouth opening

## 2016-09-19 NOTE — Transfer of Care (Signed)
Immediate Anesthesia Transfer of Care Note  Patient: Tina Hubbard  Procedure(s) Performed: Procedure(s) with comments: SHOULDER ARTHROSCOPY WITH MINI OPEN ROTATOR CUFF REPAIR (Right) - Biceps tenolysis  Patient Location: PACU  Anesthesia Type:General  Level of Consciousness: awake, alert , oriented and patient cooperative  Airway & Oxygen Therapy: Patient Spontanous Breathing and Patient connected to nasal cannula oxygen  Post-op Assessment: Report given to RN and Post -op Vital signs reviewed and stable  Post vital signs: Reviewed and stable  Last Vitals:  Vitals:   09/19/16 1220 09/19/16 1225  BP: 138/85 134/82  Pulse: 93 86  Resp:    Temp:      Last Pain:  Vitals:   09/19/16 1155  TempSrc:   PainSc: 1          Complications: No apparent anesthesia complications

## 2016-09-19 NOTE — Progress Notes (Signed)
Omit, wrong time

## 2016-09-19 NOTE — H&P (Signed)
Paper H&P to be scanned into permanent record. H&P reviewed and patient re-examined. No changes. 

## 2016-09-19 NOTE — Anesthesia Preprocedure Evaluation (Signed)
Anesthesia Evaluation  Patient identified by MRN, date of birth, ID band Patient awake    Reviewed: Allergy & Precautions, NPO status , Patient's Chart, lab work & pertinent test results  History of Anesthesia Complications Negative for: history of anesthetic complications  Airway Mallampati: II  TM Distance: >3 FB Neck ROM: Full    Dental no notable dental hx.    Pulmonary neg pulmonary ROS, neg sleep apnea, neg COPD,    breath sounds clear to auscultation- rhonchi (-) wheezing      Cardiovascular hypertension, Pt. on medications (-) CAD, (-) Past MI and (-) Cardiac Stents  Rhythm:Regular Rate:Normal - Systolic murmurs and - Diastolic murmurs    Neuro/Psych PSYCHIATRIC DISORDERS Anxiety Depression negative neurological ROS     GI/Hepatic Neg liver ROS, GERD  ,  Endo/Other  negative endocrine ROSneg diabetes  Renal/GU negative Renal ROS     Musculoskeletal  (+) Arthritis ,   Abdominal (+) - obese,   Peds  Hematology negative hematology ROS (+)   Anesthesia Other Findings Past Medical History: No date: Anxiety No date: Arthritis     Comment: Psoriatic No date: DDD (degenerative disc disease), lumbar No date: Depression No date: GERD (gastroesophageal reflux disease) No date: Hyperlipidemia No date: Hypertension No date: Osteoarthritis No date: Pre-diabetes   Reproductive/Obstetrics                             Anesthesia Physical Anesthesia Plan  ASA: II  Anesthesia Plan: General   Post-op Pain Management:  Regional for Post-op pain   Induction: Intravenous  PONV Risk Score and Plan: 2 and Ondansetron and Dexamethasone  Airway Management Planned: Oral ETT  Additional Equipment:   Intra-op Plan:   Post-operative Plan: Extubation in OR  Informed Consent: I have reviewed the patients History and Physical, chart, labs and discussed the procedure including the risks,  benefits and alternatives for the proposed anesthesia with the patient or authorized representative who has indicated his/her understanding and acceptance.   Dental advisory given  Plan Discussed with: CRNA and Anesthesiologist  Anesthesia Plan Comments:         Anesthesia Quick Evaluation

## 2016-09-19 NOTE — OR Nursing (Signed)
Pt to PACU for block

## 2016-09-19 NOTE — Anesthesia Postprocedure Evaluation (Signed)
Anesthesia Post Note  Patient: Tina Hubbard  Procedure(s) Performed: Procedure(s) (LRB): SHOULDER ARTHROSCOPY WITH MINI OPEN ROTATOR CUFF REPAIR (Right)  Patient location during evaluation: PACU Anesthesia Type: General Level of consciousness: awake and alert and oriented Pain management: pain level controlled Vital Signs Assessment: post-procedure vital signs reviewed and stable Respiratory status: spontaneous breathing, nonlabored ventilation and respiratory function stable Cardiovascular status: blood pressure returned to baseline and stable Postop Assessment: no signs of nausea or vomiting Anesthetic complications: no     Last Vitals:  Vitals:   09/19/16 1500 09/19/16 1517  BP: 132/70 131/75  Pulse: 97 97  Resp: 18 16  Temp:  36.7 C    Last Pain:  Vitals:   09/19/16 1517  TempSrc: Temporal  PainSc:                  Airiel Oblinger

## 2016-09-19 NOTE — Discharge Instructions (Addendum)
Keep dressing dry and intact.  May shower after dressing changed on post-op day #4 (Saturday).  Cover staples with Band-Aids after drying off. Apply ice frequently to shoulder. Take naproxen 2 tablets BID with meals for 7-10 days, then as necessary. Take Norco as prescribed or ES Tylenol when needed.  Keep shoulder immobilizer on at all times except may remove for bathing purposes. Follow-up in 10-14 days or as scheduled.  AMBULATORY SURGERY  DISCHARGE INSTRUCTIONS   1) The drugs that you were given will stay in your system until tomorrow so for the next 24 hours you should not:  A) Drive an automobile B) Make any legal decisions C) Drink any alcoholic beverage   2) You may resume regular meals tomorrow.  Today it is better to start with liquids and gradually work up to solid foods.  You may eat anything you prefer, but it is better to start with liquids, then soup and crackers, and gradually work up to solid foods.   3) Please notify your doctor immediately if you have any unusual bleeding, trouble breathing, redness and pain at the surgery site, drainage, fever, or pain not relieved by medication.    4) Additional Instructions:   Please contact your physician with any problems or Same Day Surgery at [phone], Monday through Friday 6 am to 4 pm, or [place] at [facility] number at [phone].

## 2016-09-19 NOTE — Anesthesia Procedure Notes (Signed)
Anesthesia Regional Block: Interscalene brachial plexus block   Pre-Anesthetic Checklist: ,, timeout performed, Correct Patient, Correct Site, Correct Laterality, Correct Procedure, Correct Position, site marked, Risks and benefits discussed,  Surgical consent,  Pre-op evaluation,  At surgeon's request and post-op pain management  Laterality: Right  Prep: chloraprep       Needles:  Injection technique: Single-shot  Needle Type: Stimiplex     Needle Length: 9cm  Needle Gauge: 21     Additional Needles:   Procedures: ultrasound guided,,,,,,,,  Narrative:  Start time: 09/19/2016 12:15 PM End time: 09/19/2016 12:25 PM Injection made incrementally with aspirations every 5 mL.  Performed by: Personally  Anesthesiologist: Judith Demps  Additional Notes: Functioning IV was confirmed and monitors were applied.  A Stimuplex needle was used. Sterile prep and drape,hand hygiene and sterile gloves were used.  Negative aspiration and negative test dose prior to incremental administration of local anesthetic. The patient tolerated the procedure well.

## 2016-09-20 ENCOUNTER — Encounter: Payer: Self-pay | Admitting: Surgery

## 2017-01-10 ENCOUNTER — Ambulatory Visit
Admission: RE | Admit: 2017-01-10 | Discharge: 2017-01-10 | Disposition: A | Discharge status: Home / Self Care | Payer: Medicare Other | Source: Ambulatory Visit | Attending: Unknown Physician Specialty | Admitting: Unknown Physician Specialty

## 2017-01-10 ENCOUNTER — Ambulatory Visit: Payer: Medicare Other | Admitting: Anesthesiology

## 2017-01-10 ENCOUNTER — Encounter
Admission: RE | Disposition: A | Discharge status: Home / Self Care | Payer: Self-pay | Source: Ambulatory Visit | Attending: Unknown Physician Specialty

## 2017-01-10 ENCOUNTER — Encounter: Payer: Self-pay | Admitting: *Deleted

## 2017-01-10 DIAGNOSIS — I10 Essential (primary) hypertension: Secondary | ICD-10-CM | POA: Diagnosis not present

## 2017-01-10 DIAGNOSIS — K219 Gastro-esophageal reflux disease without esophagitis: Secondary | ICD-10-CM | POA: Diagnosis not present

## 2017-01-10 DIAGNOSIS — Z1211 Encounter for screening for malignant neoplasm of colon: Secondary | ICD-10-CM | POA: Diagnosis not present

## 2017-01-10 DIAGNOSIS — I2510 Atherosclerotic heart disease of native coronary artery without angina pectoris: Secondary | ICD-10-CM | POA: Diagnosis not present

## 2017-01-10 DIAGNOSIS — L4050 Arthropathic psoriasis, unspecified: Secondary | ICD-10-CM | POA: Diagnosis not present

## 2017-01-10 DIAGNOSIS — D124 Benign neoplasm of descending colon: Secondary | ICD-10-CM | POA: Insufficient documentation

## 2017-01-10 DIAGNOSIS — Z96659 Presence of unspecified artificial knee joint: Secondary | ICD-10-CM | POA: Diagnosis not present

## 2017-01-10 DIAGNOSIS — Z86010 Personal history of colonic polyps: Secondary | ICD-10-CM | POA: Diagnosis not present

## 2017-01-10 DIAGNOSIS — E785 Hyperlipidemia, unspecified: Secondary | ICD-10-CM | POA: Insufficient documentation

## 2017-01-10 DIAGNOSIS — Z981 Arthrodesis status: Secondary | ICD-10-CM | POA: Diagnosis not present

## 2017-01-10 DIAGNOSIS — K640 First degree hemorrhoids: Secondary | ICD-10-CM | POA: Diagnosis not present

## 2017-01-10 DIAGNOSIS — D125 Benign neoplasm of sigmoid colon: Secondary | ICD-10-CM | POA: Diagnosis not present

## 2017-01-10 DIAGNOSIS — Z90710 Acquired absence of both cervix and uterus: Secondary | ICD-10-CM | POA: Diagnosis not present

## 2017-01-10 DIAGNOSIS — Z79899 Other long term (current) drug therapy: Secondary | ICD-10-CM | POA: Insufficient documentation

## 2017-01-10 DIAGNOSIS — R7303 Prediabetes: Secondary | ICD-10-CM | POA: Diagnosis not present

## 2017-01-10 DIAGNOSIS — Z885 Allergy status to narcotic agent status: Secondary | ICD-10-CM | POA: Insufficient documentation

## 2017-01-10 DIAGNOSIS — F329 Major depressive disorder, single episode, unspecified: Secondary | ICD-10-CM | POA: Diagnosis not present

## 2017-01-10 DIAGNOSIS — F419 Anxiety disorder, unspecified: Secondary | ICD-10-CM | POA: Diagnosis not present

## 2017-01-10 DIAGNOSIS — M1990 Unspecified osteoarthritis, unspecified site: Secondary | ICD-10-CM | POA: Diagnosis not present

## 2017-01-10 HISTORY — DX: Benign neoplasm of colon, unspecified: D12.6

## 2017-01-10 HISTORY — PX: COLONOSCOPY WITH PROPOFOL: SHX5780

## 2017-01-10 HISTORY — DX: Atherosclerotic heart disease of native coronary artery without angina pectoris: I25.10

## 2017-01-10 SURGERY — COLONOSCOPY WITH PROPOFOL
Anesthesia: General

## 2017-01-10 MED ORDER — LIDOCAINE HCL (PF) 1 % IJ SOLN
INTRAMUSCULAR | Status: AC
  Administered 2017-01-10: 0.3 mL via INTRADERMAL
  Filled 2017-01-10: qty 2

## 2017-01-10 MED ORDER — LIDOCAINE HCL (CARDIAC) 20 MG/ML IV SOLN
INTRAVENOUS | Status: DC | PRN
  Administered 2017-01-10: 100 mg via INTRAVENOUS

## 2017-01-10 MED ORDER — LIDOCAINE HCL (PF) 1 % IJ SOLN
2.0000 mL | Freq: Once | INTRAMUSCULAR | Status: AC
  Administered 2017-01-10: 0.3 mL via INTRADERMAL

## 2017-01-10 MED ORDER — SODIUM CHLORIDE 0.9 % IV SOLN
INTRAVENOUS | Status: DC
  Administered 2017-01-10: 1000 mL via INTRAVENOUS

## 2017-01-10 MED ORDER — PROPOFOL 500 MG/50ML IV EMUL
INTRAVENOUS | Status: DC | PRN
  Administered 2017-01-10: 150 ug/kg/min via INTRAVENOUS

## 2017-01-10 MED ORDER — PROPOFOL 10 MG/ML IV BOLUS
INTRAVENOUS | Status: DC | PRN
  Administered 2017-01-10: 80 mg via INTRAVENOUS
  Administered 2017-01-10: 50 mg via INTRAVENOUS

## 2017-01-10 MED ORDER — PROPOFOL 500 MG/50ML IV EMUL
INTRAVENOUS | Status: AC
  Filled 2017-01-10: qty 50

## 2017-01-10 MED ORDER — PIPERACILLIN-TAZOBACTAM 3.375 G IVPB
3.3750 g | Freq: Once | INTRAVENOUS | Status: AC
  Administered 2017-01-10: 3.375 g via INTRAVENOUS

## 2017-01-10 MED ORDER — SODIUM CHLORIDE 0.9 % IV SOLN: INTRAVENOUS | Status: DC

## 2017-01-10 MED ORDER — LIDOCAINE HCL (PF) 2 % IJ SOLN
INTRAMUSCULAR | Status: AC
  Filled 2017-01-10: qty 10

## 2017-01-10 MED ORDER — PIPERACILLIN-TAZOBACTAM 3.375 G IVPB
INTRAVENOUS | Status: AC
  Administered 2017-01-10: 3.375 g via INTRAVENOUS
  Filled 2017-01-10: qty 50

## 2017-01-10 NOTE — Anesthesia Post-op Follow-up Note (Signed)
Anesthesia QCDR form completed.        

## 2017-01-10 NOTE — H&P (Signed)
Primary Care Physician:  Dion Body, MD Primary Gastroenterologist:  Dr. Vira Agar  Pre-Procedure History & Physical: HPI:  Tina Hubbard is a 71 y.o. female is here for an colonoscopy.   Past Medical History:  Diagnosis Date  . Adenoma of colon   . Anxiety   . Arthritis    Psoriatic  . Coronary artery disease   . DDD (degenerative disc disease), lumbar   . Depression   . GERD (gastroesophageal reflux disease)   . Hyperlipidemia   . Hypertension   . Osteoarthritis   . Pre-diabetes     Past Surgical History:  Procedure Laterality Date  . ABDOMINAL HYSTERECTOMY    . ANGIOPLASTY    . BACK SURGERY  2015   SPINAL FUSION [facility]  . BILATERAL CARPAL TUNNEL RELEASE Bilateral   . BREAST BIOPSY Left yrs ago   EXCISIONAL X 2 - NEG  . CHOLECYSTECTOMY    . COLONOSCOPY    . EYE SURGERY Bilateral    Cataract Extraction with IOL  . FLEXIBLE SIGMOIDOSCOPY    . JOINT REPLACEMENT Bilateral 2003   TOTAL KNEE REPLACEMENTS DR CALIFF [facility]  . LUMBAR LAMINECTOMY    . SHOULDER ARTHROSCOPY WITH OPEN ROTATOR CUFF REPAIR Right 09/19/2016   Procedure: SHOULDER ARTHROSCOPY WITH MINI OPEN ROTATOR CUFF REPAIR;  Surgeon: Corky Mull, MD;  Location: [facility] ORS;  Service: Orthopedics;  Laterality: Right;  Biceps tenolysis  . TRIGGER FINGER RELEASE      Prior to Admission medications   Medication Sig Start Date End Date Taking? Authorizing Provider  amLODipine (NORVASC) 5 MG tablet TAKE ONE TABLET EVERY DAY 05/22/16   [provider]  atorvastatin (LIPITOR) 20 MG tablet TAKE ONE TABLET BY MOUTH EVERY DAY 03/28/16   [provider]  Besifloxacin HCl (BESIVANCE) 0.6 % SUSP Apply 1 drop to eye 3 (three) times daily. Left eye only    [provider]  Bromfenac Sodium (PROLENSA) 0.07 % SOLN Apply 1 drop to eye at bedtime. Left eye only    [provider]  Difluprednate (DUREZOL) 0.05 % EMUL Apply 1 drop to eye 3 (three) times daily. Left eye only     [provider]  DULoxetine (CYMBALTA) 60 MG capsule Take 60 mg by mouth daily. 08/15/16   [provider]  estrogens, conjugated, (PREMARIN) 0.625 MG tablet TAKE ONE TABLET BY MOUTH EVERY DAY 01/06/16   [provider]  etanercept (ENBREL SURECLICK) 50 MG/ML injection Inject 50 mg into the skin once a week. MONDAYS 02/04/16   [provider]  gabapentin (NEURONTIN) 300 MG capsule Take 300 mg by mouth 3 (three) times daily. 08/01/16 08/01/17  [provider]  HYDROcodone-acetaminophen (NORCO) 5-325 MG tablet Take 1-2 tablets by mouth every 6 (six) hours as needed for moderate pain. MAXIMUM TOTAL ACETAMINOPHEN DOSE IS 4000 MG PER DAY 09/19/16   Poggi, Marshall Cork, MD  meloxicam (MOBIC) 7.5 MG tablet TAKE ONE TABLET BY MOUTH EVERY DAY WITH FOOD 02/21/16   [provider]  naproxen sodium (ANAPROX) 220 MG tablet Take 440 mg by mouth daily as needed (PAIN).    [provider]  omeprazole (PRILOSEC) 20 MG capsule TAKE 1 CAPSULE BY MOUTH EVERY DAY 04/19/16   [provider]  traZODone (DESYREL) 50 MG tablet Take 25 mg by mouth at bedtime.  01/31/16 01/30/17  [provider]  valsartan-hydrochlorothiazide (DIOVAN-HCT) 320-12.5 MG tablet TAKE ONE TABLET EVERY DAY 05/22/16   [provider]    Allergies as  of 07/12/2016 - Review Complete 06/22/2016  Allergen Reaction Noted  . Oxycodone Other (See Comments) 12/21/2014    Family History  Problem Relation Age of Onset  . Uterine cancer Mother 67  . Diabetes Mother   . Diabetes Sister   . Breast cancer Neg Hx     Social History   Social History  . Marital status: Married    Spouse name: N/A  . Number of children: N/A  . Years of education: N/A   Occupational History  . Not on file.   Social History Main Topics  . Smoking status: Never Smoker  . Smokeless tobacco: Never Used  . Alcohol use Yes     Comment: glass of wine per month  . Drug use: No  . Sexual  activity: Not on file   Other Topics Concern  . Not on file   Social History Narrative  . No narrative on file    Review of Systems: See HPI, otherwise negative ROS  Physical Exam: BP (!) 143/74   Pulse 86   Temp (!) 96.9 F (36.1 C) (Tympanic)   Resp 17   Ht 5\' 4"  (1.626 m)   Wt 76.7 kg (169 lb)   SpO2 100%   BMI 29.01 kg/m  General:   Alert,  pleasant and cooperative in NAD Head:  Normocephalic and atraumatic. Neck:  Supple; no masses or thyromegaly. Lungs:  Clear throughout to auscultation.    Heart:  Regular rate and rhythm. Abdomen:  Soft, nontender and nondistended. Normal bowel sounds, without guarding, and without rebound.   Neurologic:  Alert and  oriented x4;  grossly normal neurologically.  Impression/Plan: Tina Hubbard is here for an colonoscopy to be performed for Tina Hubbard colon polyps.  Risks, benefits, limitations, and alternatives regarding  colonoscopy have been reviewed with the patient.  Questions have been answered.  All parties agreeable.   Gaylyn Cheers, MD  01/10/2017, 8:46 AM

## 2017-01-10 NOTE — Anesthesia Postprocedure Evaluation (Signed)
Anesthesia Post Note  Patient: Joseph Pierini  Procedure(s) Performed: COLONOSCOPY WITH PROPOFOL (N/A )  Patient location during evaluation: Endoscopy Anesthesia Type: General Level of consciousness: awake and alert Pain management: pain level controlled Vital Signs Assessment: post-procedure vital signs reviewed and stable Respiratory status: spontaneous breathing, nonlabored ventilation, respiratory function stable and patient connected to nasal cannula oxygen Cardiovascular status: blood pressure returned to baseline and stable Postop Assessment: no apparent nausea or vomiting Anesthetic complications: no     Last Vitals:  Vitals:   01/10/17 0940 01/10/17 0950  BP: 112/67 118/79  Pulse: 81 92  Resp: (!) 23 16  Temp:    SpO2: 98% 99%    Last Pain:  Vitals:   01/10/17 0838  TempSrc: Tympanic                 Martha Clan

## 2017-01-10 NOTE — Anesthesia Preprocedure Evaluation (Signed)
Anesthesia Evaluation  Patient identified by MRN, date of birth, ID band Patient awake    Reviewed: Allergy & Precautions, H&P , NPO status , Patient's Chart, lab work & pertinent test results, reviewed documented beta blocker date and time   History of Anesthesia Complications Negative for: history of anesthetic complications  Airway Mallampati: I  TM Distance: >3 FB Neck ROM: full    Dental  (+) Caps, Dental Advidsory Given, Teeth Intact   Pulmonary neg pulmonary ROS,           Cardiovascular Exercise Tolerance: Good hypertension, (-) angina+ CAD  (-) Past MI, (-) Cardiac Stents and (-) CABG (-) dysrhythmias (-) Valvular Problems/Murmurs     Neuro/Psych negative neurological ROS  negative psych ROS   GI/Hepatic Neg liver ROS, GERD  ,  Endo/Other  negative endocrine ROS  Renal/GU negative Renal ROS  negative genitourinary   Musculoskeletal   Abdominal   Peds  Hematology negative hematology ROS (+)   Anesthesia Other Findings Past Medical History: No date: Adenoma of colon No date: Anxiety No date: Arthritis     Comment:  Psoriatic No date: Coronary artery disease No date: DDD (degenerative disc disease), lumbar No date: Depression No date: GERD (gastroesophageal reflux disease) No date: Hyperlipidemia No date: Hypertension No date: Osteoarthritis No date: Pre-diabetes   Reproductive/Obstetrics negative OB ROS                             Anesthesia Physical Anesthesia Plan  ASA: II  Anesthesia Plan: General   Post-op Pain Management:    Induction: Intravenous  PONV Risk Score and Plan: 3 and Propofol infusion  Airway Management Planned: Nasal Cannula  Additional Equipment:   Intra-op Plan:   Post-operative Plan:   Informed Consent: I have reviewed the patients History and Physical, chart, labs and discussed the procedure including the risks, benefits and  alternatives for the proposed anesthesia with the patient or authorized representative who has indicated his/her understanding and acceptance.   Dental Advisory Given  Plan Discussed with: Anesthesiologist, CRNA and Surgeon  Anesthesia Plan Comments:         Anesthesia Quick Evaluation

## 2017-01-10 NOTE — Transfer of Care (Signed)
Immediate Anesthesia Transfer of Care Note  Patient: Tina Hubbard  Procedure(s) Performed: COLONOSCOPY WITH PROPOFOL (N/A )  Patient Location: Endoscopy Unit  Anesthesia Type:General  Level of Consciousness: awake and alert   Airway & Oxygen Therapy: Patient Spontanous Breathing and Patient connected to nasal cannula oxygen  Post-op Assessment: Report given to RN and Post -op Vital signs reviewed and stable  Post vital signs: Reviewed and stable  Last Vitals:  Vitals:   01/10/17 0838 01/10/17 0930  BP: (!) 143/74 112/67  Pulse: 86 82  Resp: 17 (!) 22  Temp: (!) 36.1 C (!) 36.1 C  SpO2: 100% 100%    Last Pain:  Vitals:   01/10/17 0838  TempSrc: Tympanic         Complications: No apparent anesthesia complications

## 2017-01-10 NOTE — Op Note (Signed)
[facility] Gastroenterology Patient Name: Tina Hubbard Procedure Date: 01/10/2017 9:05 AM MRN: [id number] Account #: [id number] Date of Birth: [birth year] Admit Type: Outpatient Age: 71 Room: [facility] ENDO ROOM 4 Gender: Female Note Status: Finalized Procedure:            Colonoscopy Indications:          High risk colon cancer surveillance: Personal history                        of colonic polyps Providers:            Manya Silvas, MD Referring MD:         Dion Body (Referring MD) Medicines:            Propofol per Anesthesia Complications:        No immediate complications. Procedure:            Pre-Anesthesia Assessment:                       - After reviewing the risks and benefits, the patient                        was deemed in satisfactory condition to undergo the                        procedure.                       After obtaining informed consent, the colonoscope was                        passed under direct vision. Throughout the procedure,                        the patient's blood pressure, pulse, and oxygen                        saturations were monitored continuously. The                        Colonoscope was introduced through the anus and                        advanced to the the cecum, identified by appendiceal                        orifice and ileocecal valve. The colonoscopy was                        performed without difficulty. The patient tolerated the                        procedure well. The quality of the bowel preparation                        was excellent. Findings:      Two sessile polyps were found in the sigmoid colon and descending colon.       The polyps were diminutive in size. These polyps were removed with a       cold snare. Resection and retrieval were complete.      Internal hemorrhoids  were found during endoscopy. The hemorrhoids were       small and Grade I (internal hemorrhoids that do not  prolapse). Impression:           - Two diminutive polyps in the sigmoid colon and in the                        descending colon, removed with a cold snare. Resected                        and retrieved.                       - Internal hemorrhoids. Recommendation:       - Await pathology results. Procedure Code(s):    --- Professional ---                       [id number], Colonoscopy, flexible; with removal of tumor(s),                        polyp(s), or other lesion(s) by snare technique CPT copyright 2016 American Medical Association. All rights reserved. The codes documented in this report are preliminary and upon coder review may  be revised to meet current compliance requirements. Manya Silvas, MD 01/10/2017 9:38:28 AM This report has been signed electronically. Number of Addenda: 0 Note Initiated On: 01/10/2017 9:05 AM Scope Withdrawal Time: 0 hours 16 minutes 53 seconds  Total Procedure Duration: 0 hours 24 minutes 39 seconds       [facility]

## 2017-01-11 ENCOUNTER — Encounter: Payer: Self-pay | Admitting: Unknown Physician Specialty

## 2017-01-11 LAB — SURGICAL PATHOLOGY

## 2017-02-13 ENCOUNTER — Encounter: Payer: Self-pay | Admitting: *Deleted

## 2017-02-21 ENCOUNTER — Ambulatory Visit: Payer: Medicare Other | Admitting: Anesthesiology

## 2017-02-21 ENCOUNTER — Encounter
Admission: RE | Disposition: A | Discharge status: Home / Self Care | Payer: Self-pay | Source: Ambulatory Visit | Attending: Surgery

## 2017-02-21 ENCOUNTER — Ambulatory Visit
Admission: RE | Admit: 2017-02-21 | Discharge: 2017-02-21 | Disposition: A | Discharge status: Home / Self Care | Payer: Medicare Other | Source: Ambulatory Visit | Attending: Surgery | Admitting: Surgery

## 2017-02-21 DIAGNOSIS — F329 Major depressive disorder, single episode, unspecified: Secondary | ICD-10-CM | POA: Insufficient documentation

## 2017-02-21 DIAGNOSIS — I2510 Atherosclerotic heart disease of native coronary artery without angina pectoris: Secondary | ICD-10-CM | POA: Diagnosis not present

## 2017-02-21 DIAGNOSIS — Z86010 Personal history of colonic polyps: Secondary | ICD-10-CM | POA: Diagnosis not present

## 2017-02-21 DIAGNOSIS — Z9861 Coronary angioplasty status: Secondary | ICD-10-CM | POA: Insufficient documentation

## 2017-02-21 DIAGNOSIS — Z885 Allergy status to narcotic agent status: Secondary | ICD-10-CM | POA: Insufficient documentation

## 2017-02-21 DIAGNOSIS — I10 Essential (primary) hypertension: Secondary | ICD-10-CM | POA: Diagnosis not present

## 2017-02-21 DIAGNOSIS — L4050 Arthropathic psoriasis, unspecified: Secondary | ICD-10-CM | POA: Insufficient documentation

## 2017-02-21 DIAGNOSIS — E119 Type 2 diabetes mellitus without complications: Secondary | ICD-10-CM | POA: Insufficient documentation

## 2017-02-21 DIAGNOSIS — G5622 Lesion of ulnar nerve, left upper limb: Secondary | ICD-10-CM | POA: Insufficient documentation

## 2017-02-21 DIAGNOSIS — M5136 Other intervertebral disc degeneration, lumbar region: Secondary | ICD-10-CM | POA: Diagnosis not present

## 2017-02-21 DIAGNOSIS — Z96653 Presence of artificial knee joint, bilateral: Secondary | ICD-10-CM | POA: Diagnosis not present

## 2017-02-21 DIAGNOSIS — K219 Gastro-esophageal reflux disease without esophagitis: Secondary | ICD-10-CM | POA: Insufficient documentation

## 2017-02-21 DIAGNOSIS — Z8249 Family history of ischemic heart disease and other diseases of the circulatory system: Secondary | ICD-10-CM | POA: Insufficient documentation

## 2017-02-21 DIAGNOSIS — Z79899 Other long term (current) drug therapy: Secondary | ICD-10-CM | POA: Insufficient documentation

## 2017-02-21 DIAGNOSIS — E785 Hyperlipidemia, unspecified: Secondary | ICD-10-CM | POA: Insufficient documentation

## 2017-02-21 HISTORY — DX: Pain in left leg: M79.605

## 2017-02-21 HISTORY — PX: ULNAR NERVE TRANSPOSITION: SHX2595

## 2017-02-21 SURGERY — ULNAR NERVE DECOMPRESSION/TRANSPOSITION
Anesthesia: General | Laterality: Left | Wound class: Clean

## 2017-02-21 MED ORDER — FENTANYL CITRATE (PF) 100 MCG/2ML IJ SOLN
25.0000 ug | INTRAMUSCULAR | Status: DC | PRN
Start: 2017-02-21 — End: 2017-02-21

## 2017-02-21 MED ORDER — ROPIVACAINE HCL 5 MG/ML IJ SOLN
INTRAMUSCULAR | Status: DC | PRN
  Administered 2017-02-21: 30 mL via EPIDURAL

## 2017-02-21 MED ORDER — BUPIVACAINE HCL (PF) 0.5 % IJ SOLN
INTRAMUSCULAR | Status: DC | PRN
  Administered 2017-02-21: 10 mL

## 2017-02-21 MED ORDER — FENTANYL CITRATE (PF) 100 MCG/2ML IJ SOLN
INTRAMUSCULAR | Status: DC | PRN
  Administered 2017-02-21: 100 ug via INTRAVENOUS

## 2017-02-21 MED ORDER — ACETAMINOPHEN 325 MG PO TABS: 325.0000 mg | ORAL_TABLET | ORAL | Status: DC | PRN

## 2017-02-21 MED ORDER — CEFAZOLIN SODIUM 1 G IJ SOLR: 2000.0000 mg | Freq: Once | INTRAMUSCULAR | Status: DC

## 2017-02-21 MED ORDER — PROPOFOL 500 MG/50ML IV EMUL
INTRAVENOUS | Status: DC | PRN
  Administered 2017-02-21: 25 ug/kg/min via INTRAVENOUS

## 2017-02-21 MED ORDER — ONDANSETRON HCL 4 MG PO TABS: 4.0000 mg | ORAL_TABLET | Freq: Four times a day (QID) | ORAL | Status: DC | PRN

## 2017-02-21 MED ORDER — METOCLOPRAMIDE HCL 5 MG PO TABS: 5.0000 mg | ORAL_TABLET | Freq: Three times a day (TID) | ORAL | Status: DC | PRN

## 2017-02-21 MED ORDER — OXYCODONE HCL 5 MG PO TABS: 5.0000 mg | ORAL_TABLET | Freq: Once | ORAL | Status: DC | PRN

## 2017-02-21 MED ORDER — ACETAMINOPHEN 10 MG/ML IV SOLN
1000.0000 mg | Freq: Once | INTRAVENOUS | Status: AC
  Administered 2017-02-21: 1000 mg via INTRAVENOUS

## 2017-02-21 MED ORDER — MIDAZOLAM HCL 2 MG/2ML IJ SOLN
INTRAMUSCULAR | Status: DC | PRN
  Administered 2017-02-21: 2 mg via INTRAVENOUS

## 2017-02-21 MED ORDER — PROMETHAZINE HCL 25 MG/ML IJ SOLN: 6.2500 mg | INTRAMUSCULAR | Status: DC | PRN

## 2017-02-21 MED ORDER — POTASSIUM CHLORIDE IN NACL 20-0.9 MEQ/L-% IV SOLN: INTRAVENOUS | Status: DC

## 2017-02-21 MED ORDER — LIDOCAINE HCL (CARDIAC) 20 MG/ML IV SOLN
INTRAVENOUS | Status: DC | PRN
  Administered 2017-02-21: 50 mg via INTRAVENOUS

## 2017-02-21 MED ORDER — METOCLOPRAMIDE HCL 5 MG/ML IJ SOLN: 5.0000 mg | Freq: Three times a day (TID) | INTRAMUSCULAR | Status: DC | PRN

## 2017-02-21 MED ORDER — OXYCODONE HCL 5 MG/5ML PO SOLN: 5.0000 mg | Freq: Once | ORAL | Status: DC | PRN

## 2017-02-21 MED ORDER — ACETAMINOPHEN 160 MG/5ML PO SOLN: 325.0000 mg | ORAL | Status: DC | PRN

## 2017-02-21 MED ORDER — TRAMADOL HCL 50 MG PO TABS: 50.0000 mg | ORAL_TABLET | Freq: Four times a day (QID) | ORAL | 0 refills | Status: AC | PRN

## 2017-02-21 MED ORDER — LACTATED RINGERS IV SOLN
10.0000 mL/h | INTRAVENOUS | Status: DC
  Administered 2017-02-21: 10 mL/h via INTRAVENOUS

## 2017-02-21 MED ORDER — ONDANSETRON HCL 4 MG/2ML IJ SOLN: 4.0000 mg | Freq: Four times a day (QID) | INTRAMUSCULAR | Status: DC | PRN

## 2017-02-21 SURGICAL SUPPLY — 33 items
ADHESIVE MASTISOL STRL (MISCELLANEOUS) ×3 IMPLANT
BANDAGE ELASTIC 3 LF NS (GAUZE/BANDAGES/DRESSINGS) ×3 IMPLANT
BANDAGE ELASTIC 4 LF NS (GAUZE/BANDAGES/DRESSINGS) IMPLANT
BNDG COHESIVE 4X5 TAN STRL (GAUZE/BANDAGES/DRESSINGS) ×3 IMPLANT
BNDG ESMARK 4X12 TAN STRL LF (GAUZE/BANDAGES/DRESSINGS) ×3 IMPLANT
CANISTER SUCT 1200ML W/VALVE (MISCELLANEOUS) ×3 IMPLANT
CHLORAPREP W/TINT 26ML (MISCELLANEOUS) ×3 IMPLANT
CLOSURE WOUND 1/4X4 (GAUZE/BANDAGES/DRESSINGS) ×1
CORD BIP STRL DISP 12FT (MISCELLANEOUS) ×3 IMPLANT
COVER LIGHT HANDLE UNIVERSAL (MISCELLANEOUS) ×6 IMPLANT
DRAPE U-SHAPE 48X52 POLY STRL (PACKS) ×3 IMPLANT
GAUZE PETRO XEROFOAM 1X8 (MISCELLANEOUS) ×3 IMPLANT
GAUZE SPONGE 4X4 12PLY STRL (GAUZE/BANDAGES/DRESSINGS) ×3 IMPLANT
GLOVE BIO SURGEON STRL SZ7.5 (GLOVE) ×3 IMPLANT
GLOVE INDICATOR 8.0 STRL GRN (GLOVE) ×3 IMPLANT
GOWN STRL REUS W/ TWL LRG LVL3 (GOWN DISPOSABLE) ×1 IMPLANT
GOWN STRL REUS W/ TWL XL LVL3 (GOWN DISPOSABLE) ×1 IMPLANT
GOWN STRL REUS W/TWL LRG LVL3 (GOWN DISPOSABLE) ×2
GOWN STRL REUS W/TWL XL LVL3 (GOWN DISPOSABLE) ×2
KIT ROOM TURNOVER OR (KITS) ×3 IMPLANT
LOOP VESSEL RED MINI 1.3X0.9 (MISCELLANEOUS) IMPLANT
LOOPS RED MINI 1.3MMX0.9MM (MISCELLANEOUS)
NS IRRIG 500ML POUR BTL (IV SOLUTION) ×3 IMPLANT
PACK EXTREMITY ARMC (MISCELLANEOUS) ×3 IMPLANT
PAD GROUND ADULT SPLIT (MISCELLANEOUS) IMPLANT
SPLINT WRIST/FOREARM LG LT TX9 (SOFTGOODS) ×3 IMPLANT
STOCKINETTE IMPERVIOUS LG (DRAPES) ×3 IMPLANT
STRAP BODY AND KNEE 60X3 (MISCELLANEOUS) ×3 IMPLANT
STRIP CLOSURE SKIN 1/4X4 (GAUZE/BANDAGES/DRESSINGS) ×2 IMPLANT
SUT VIC AB 2-0 CT1 27 (SUTURE) ×2
SUT VIC AB 2-0 CT1 TAPERPNT 27 (SUTURE) ×1 IMPLANT
SUT VIC AB 3-0 SH 27 (SUTURE) ×2
SUT VIC AB 3-0 SH 27X BRD (SUTURE) ×1 IMPLANT

## 2017-02-21 NOTE — Progress Notes (Signed)
Assisted Beola Cord, ANMD with left, ultrasound guided, interscalene  block. Side rails up, monitors on throughout procedure. See vital signs in flow sheet. Tolerated Procedure well.

## 2017-02-21 NOTE — Anesthesia Postprocedure Evaluation (Signed)
Anesthesia Post Note  Patient: Tina Hubbard  Procedure(s) Performed: ULNAR NERVE DECOMPRESSION WRIST GUYON'S CANAL (Left )  Patient location during evaluation: PACU Anesthesia Type: General Level of consciousness: awake and alert Pain management: pain level controlled Vital Signs Assessment: post-procedure vital signs reviewed and stable Respiratory status: spontaneous breathing, nonlabored ventilation, respiratory function stable and patient connected to nasal cannula oxygen Cardiovascular status: blood pressure returned to baseline and stable Postop Assessment: no apparent nausea or vomiting Anesthetic complications: no    Renee Erb ELAINE

## 2017-02-21 NOTE — Anesthesia Procedure Notes (Signed)
Performed by: Daesha Insco, CRNA Pre-anesthesia Checklist: Patient identified, Emergency Drugs available, Suction available, Timeout performed and Patient being monitored Patient Re-evaluated:Patient Re-evaluated prior to induction Oxygen Delivery Method: Nasal cannula Placement Confirmation: positive ETCO2       

## 2017-02-21 NOTE — Transfer of Care (Signed)
Immediate Anesthesia Transfer of Care Note  Patient: Tina Hubbard  Procedure(s) Performed: ULNAR NERVE DECOMPRESSION WRIST GUYON'S CANAL (Left )  Patient Location: PACU  Anesthesia Type: General  Level of Consciousness: awake, alert  and patient cooperative  Airway and Oxygen Therapy: Patient Spontanous Breathing and Patient connected to supplemental oxygen  Post-op Assessment: Post-op Vital signs reviewed, Patient's Cardiovascular Status Stable, Respiratory Function Stable, Patent Airway and No signs of Nausea or vomiting  Post-op Vital Signs: Reviewed and stable  Complications: No apparent anesthesia complications

## 2017-02-21 NOTE — Anesthesia Preprocedure Evaluation (Signed)
Anesthesia Evaluation  Patient identified by MRN, date of birth, ID band Patient awake    Reviewed: Allergy & Precautions, H&P , NPO status , Patient's Chart, lab work & pertinent test results, reviewed documented beta blocker date and time   History of Anesthesia Complications Negative for: history of anesthetic complications  Airway Mallampati: I  TM Distance: >3 FB Neck ROM: full    Dental  (+) Caps, Dental Advidsory Given, Teeth Intact   Pulmonary neg pulmonary ROS,           Cardiovascular Exercise Tolerance: Good hypertension, (-) angina+ CAD  (-) Past MI, (-) Cardiac Stents and (-) CABG (-) dysrhythmias (-) Valvular Problems/Murmurs     Neuro/Psych negative neurological ROS  negative psych ROS   GI/Hepatic Neg liver ROS, GERD  ,  Endo/Other  negative endocrine ROS  Renal/GU negative Renal ROS  negative genitourinary   Musculoskeletal   Abdominal   Peds  Hematology negative hematology ROS (+)   Anesthesia Other Findings Past Medical History: No date: Adenoma of colon No date: Anxiety No date: Arthritis     Comment:  Psoriatic No date: Coronary artery disease No date: DDD (degenerative disc disease), lumbar No date: Depression No date: GERD (gastroesophageal reflux disease) No date: Hyperlipidemia No date: Hypertension No date: Osteoarthritis No date: Pre-diabetes   Reproductive/Obstetrics negative OB ROS                            Anesthesia Physical  Anesthesia Plan  ASA: II  Anesthesia Plan: General   Post-op Pain Management: GA combined w/ Regional for post-op pain   Induction: Intravenous  PONV Risk Score and Plan: 3 and Propofol infusion  Airway Management Planned:   Additional Equipment:   Intra-op Plan:   Post-operative Plan:   Informed Consent: I have reviewed the patients History and Physical, chart, labs and discussed the procedure including the  risks, benefits and alternatives for the proposed anesthesia with the patient or authorized representative who has indicated his/her understanding and acceptance.   Dental Advisory Given  Plan Discussed with: Anesthesiologist, CRNA and Surgeon  Anesthesia Plan Comments:         Anesthesia Quick Evaluation

## 2017-02-21 NOTE — H&P (Signed)
Paper H&P to be scanned into permanent record. H&P reviewed and patient re-examined. No changes. 

## 2017-02-21 NOTE — Addendum Note (Signed)
Addendum  created 02/21/17 1549 by Marice Potter, MD   Child order released for a procedure order, Intraprocedure Blocks edited, Intraprocedure Event edited, Intraprocedure Meds edited, Sign clinical note

## 2017-02-21 NOTE — Op Note (Signed)
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  Post-Op Diagnosis:   Same.  Procedure:   Open decompression of ulnar nerve at Guyon's canal, left wrist.  Surgeon:   Pascal Lux, MD  Assistant:   Doristine Mango, PA-S  Anesthesia:   IV sedation with interscalene block placed by anesthesia  Findings:   As above.  Complications:   None  EBL:   0 cc  Fluids:   600 cc crystalloid  TT:   51 minutes at 250 mmHg  Drains:   None  Closure:   4-0 Prolene interrupted sutures  Brief Clinical Note:   The patient is a 71 year old female with a history of pain and paresthesias to the ring and little fingers of her left hand. The patient's symptoms have persisted despite medications, activity modification, splinting, etc. Her history and examination consistent with compression of the ulnar nerve. An EMG/NCV has confirmed the presence of compression of the ulnar nerve at Guyon's canal. The patient presents at this time for an open decompression of the ulnar nerve in Guyon's canal of the left wrist.   Procedure:   The patient underwent placement of an interscalene block in the preoperative holding area by the anesthesiologist before she was brought into the operating room and lain in the supine position. After adequate IV sedation was achieved, the right hand and upper extremity were prepped with ChloraPrep solution before being draped sterilely. Preoperative antibiotics were administered. A timeout was performed to verify the appropriate surgical site before the limb was exsanguinated with an Esmarch and the tourniquet inflated to 250 mmHg. A zigzag type of incision was made beginning several centimeters proximal to the wrist flexion crease, extending just radial to the pisiform, then ulnar to the hamate before angling back radially parallel to the proximal palmar crease. The incision was carried down through the  subcutaneous tissues using blunt dissection with care taken to identify and protect any neurovascular structures. The ulnar neurovascular bundle was identified just distal to the volar carpal ligament. It was tracked proximally beneath the volar carpal ligament which was released using Metzenbaum scissors. The neurovascular bundle was then tracked distally. The piece of the hamate ligament was released before the nerve was tracked more distally. The fascial leash between the flexor digiti minimi and abductor digit minimi was released while tracking the motor branch as it dove into the opponens digiti minimi. The nerve and corresponding artery were inspected carefully along the length that they were released. No additional adhesions or strictures were identified. Hemostasis was achieved using bipolar electrocautery.  The wound was irrigated thoroughly with sterile saline solution before being closed using 4-0 Prolene interrupted sutures. A total of 10 cc of 0.5% plain Sensorcaine was injected in and around the incision before a sterile bulky dressing was applied to the wound. The patient was placed into a volar wrist splint before being awakened and returned to the recovery room in satisfactory condition after tolerating the procedure well.

## 2017-02-21 NOTE — Anesthesia Procedure Notes (Signed)
Anesthesia Regional Block: Supraclavicular block   Pre-Anesthetic Checklist: ,, timeout performed, Correct Patient, Correct Site, Correct Laterality, Correct Procedure, Correct Position, site marked, Risks and benefits discussed,  Surgical consent,  Pre-op evaluation,  At surgeon's request and post-op pain management  Laterality: Left  Prep: Maximum Sterile Barrier Precautions used, chloraprep       Needles:   Needle Type: Stimiplex     Needle Length: 2cm  Needle Gauge: 22     Additional Needles:   Procedures:,,,, ultrasound used (permanent image in chart),,,,  Narrative:  Start time: 02/21/2017 12:35 PM End time: 02/21/2017 12:40 PM Injection made incrementally with aspirations every 5 mL.  Performed by: Personally  Anesthesiologist: Marice Potter, MD

## 2017-02-21 NOTE — Discharge Instructions (Signed)
General Anesthesia, Adult, Care After These instructions provide you with information about caring for yourself after your procedure. Your health care provider may also give you more specific instructions. Your treatment has been planned according to current medical practices, but problems sometimes occur. Call your health care provider if you have any problems or questions after your procedure. What can I expect after the procedure? After the procedure, it is common to have:  Vomiting.  A sore throat.  Mental slowness.  It is common to feel:  Nauseous.  Cold or shivery.  Sleepy.  Tired.  Sore or achy, even in parts of your body where you did not have surgery.  Follow these instructions at home: For at least 24 hours after the procedure:  Do not: ? Participate in activities where you could fall or become injured. ? Drive. ? Use heavy machinery. ? Drink alcohol. ? Take sleeping pills or medicines that cause drowsiness. ? Make important decisions or sign legal documents. ? Take care of children on your own.  Rest. Eating and drinking  If you vomit, drink water, juice, or soup when you can drink without vomiting.  Drink enough fluid to keep your urine clear or pale yellow.  Make sure you have little or no nausea before eating solid foods.  Follow the diet recommended by your health care provider. General instructions  Have a responsible adult stay with you until you are awake and alert.  Return to your normal activities as told by your health care provider. Ask your health care provider what activities are safe for you.  Take over-the-counter and prescription medicines only as told by your health care provider.  If you smoke, do not smoke without supervision.  Keep all follow-up visits as told by your health care provider. This is important. Contact a health care provider if:  You continue to have nausea or vomiting at home, and medicines are not helpful.  You  cannot drink fluids or start eating again.  You cannot urinate after 8-12 hours.  You develop a skin rash.  You have fever.  You have increasing redness at the site of your procedure. Get help right away if:  You have difficulty breathing.  You have chest pain.  You have unexpected bleeding.  You feel that you are having a life-threatening or urgent problem. This information is not intended to replace advice given to you by your health care provider. Make sure you discuss any questions you have with your health care provider. Document Released: 06/19/2000 Document Revised: 08/16/2015 Document Reviewed: 02/25/2015 Elsevier Interactive Patient Education  2018 [employer].   Keep dressing dry and intact. Keep hand elevated above heart level. May shower after dressing removed on postop day 4 (Sunday). Cover sutures with Band-Aids after drying off. Apply ice to affected area frequently. Resume Celebrex BID with meals for 7 days, then as necessary. Take pain medication as prescribed or ES Tylenol when needed.  Return for follow-up in 10-14 days or as scheduled.

## 2017-02-23 ENCOUNTER — Encounter: Payer: Self-pay | Admitting: Surgery

## 2017-06-14 ENCOUNTER — Other Ambulatory Visit: Payer: Self-pay | Admitting: Family Medicine

## 2017-06-14 DIAGNOSIS — Z1231 Encounter for screening mammogram for malignant neoplasm of breast: Secondary | ICD-10-CM

## 2017-07-25 ENCOUNTER — Ambulatory Visit
Admission: RE | Admit: 2017-07-25 | Discharge: 2017-07-25 | Disposition: A | Discharge status: Home / Self Care | Payer: Medicare Other | Source: Ambulatory Visit | Attending: Family Medicine | Admitting: Family Medicine

## 2017-07-25 DIAGNOSIS — Z1231 Encounter for screening mammogram for malignant neoplasm of breast: Secondary | ICD-10-CM | POA: Diagnosis not present

## 2018-02-27 ENCOUNTER — Ambulatory Visit
Admission: RE | Admit: 2018-02-27 | Discharge: 2018-02-27 | Disposition: A | Discharge status: Home / Self Care | Payer: Medicare Other | Source: Ambulatory Visit | Attending: Surgery | Admitting: Surgery

## 2018-02-27 ENCOUNTER — Encounter
Admission: RE | Disposition: A | Discharge status: Home / Self Care | Payer: Self-pay | Source: Ambulatory Visit | Attending: Surgery

## 2018-02-27 ENCOUNTER — Ambulatory Visit: Payer: Medicare Other | Admitting: Anesthesiology

## 2018-02-27 DIAGNOSIS — Z7984 Long term (current) use of oral hypoglycemic drugs: Secondary | ICD-10-CM | POA: Diagnosis not present

## 2018-02-27 DIAGNOSIS — I10 Essential (primary) hypertension: Secondary | ICD-10-CM | POA: Diagnosis not present

## 2018-02-27 DIAGNOSIS — Z79899 Other long term (current) drug therapy: Secondary | ICD-10-CM | POA: Insufficient documentation

## 2018-02-27 DIAGNOSIS — G5602 Carpal tunnel syndrome, left upper limb: Secondary | ICD-10-CM | POA: Diagnosis present

## 2018-02-27 DIAGNOSIS — F329 Major depressive disorder, single episode, unspecified: Secondary | ICD-10-CM | POA: Insufficient documentation

## 2018-02-27 DIAGNOSIS — E119 Type 2 diabetes mellitus without complications: Secondary | ICD-10-CM | POA: Insufficient documentation

## 2018-02-27 DIAGNOSIS — G5622 Lesion of ulnar nerve, left upper limb: Secondary | ICD-10-CM | POA: Diagnosis not present

## 2018-02-27 DIAGNOSIS — L4050 Arthropathic psoriasis, unspecified: Secondary | ICD-10-CM | POA: Insufficient documentation

## 2018-02-27 DIAGNOSIS — I2510 Atherosclerotic heart disease of native coronary artery without angina pectoris: Secondary | ICD-10-CM | POA: Insufficient documentation

## 2018-02-27 DIAGNOSIS — E785 Hyperlipidemia, unspecified: Secondary | ICD-10-CM | POA: Diagnosis not present

## 2018-02-27 HISTORY — PX: TENNIS ELBOW RELEASE/NIRSCHEL PROCEDURE: SHX6651

## 2018-02-27 HISTORY — PX: CARPAL TUNNEL RELEASE: SHX101

## 2018-02-27 HISTORY — DX: Serous retinal detachment, unspecified eye: H33.20

## 2018-02-27 SURGERY — CARPAL TUNNEL RELEASE
Anesthesia: General | Site: Wrist | Laterality: Left

## 2018-02-27 MED ORDER — ONDANSETRON HCL 4 MG/2ML IJ SOLN
INTRAMUSCULAR | Status: DC | PRN
  Administered 2018-02-27: 4 mg via INTRAVENOUS

## 2018-02-27 MED ORDER — ROPIVACAINE HCL 5 MG/ML IJ SOLN
INTRAMUSCULAR | Status: DC | PRN
  Administered 2018-02-27: 35 mL via PERINEURAL

## 2018-02-27 MED ORDER — PROPOFOL 500 MG/50ML IV EMUL
INTRAVENOUS | Status: DC | PRN
  Administered 2018-02-27: 75 ug/kg/min via INTRAVENOUS

## 2018-02-27 MED ORDER — PROPOFOL 10 MG/ML IV BOLUS
INTRAVENOUS | Status: DC | PRN
  Administered 2018-02-27: 50 mg via INTRAVENOUS

## 2018-02-27 MED ORDER — BUPIVACAINE HCL (PF) 0.5 % IJ SOLN
INTRAMUSCULAR | Status: DC | PRN
  Administered 2018-02-27 (×2): 10 mL

## 2018-02-27 MED ORDER — MIDAZOLAM HCL 2 MG/2ML IJ SOLN
INTRAMUSCULAR | Status: DC | PRN
  Administered 2018-02-27: 2 mg via INTRAVENOUS

## 2018-02-27 MED ORDER — DEXTROSE 5 % IV SOLN
2000.0000 mg | Freq: Once | INTRAVENOUS | Status: AC
  Administered 2018-02-27: 2000 mg via INTRAVENOUS

## 2018-02-27 MED ORDER — LIDOCAINE HCL (CARDIAC) PF 100 MG/5ML IV SOSY
PREFILLED_SYRINGE | INTRAVENOUS | Status: DC | PRN
  Administered 2018-02-27: 40 mg via INTRAVENOUS

## 2018-02-27 MED ORDER — HYDROCODONE-ACETAMINOPHEN 5-325 MG PO TABS: 1.0000 | ORAL_TABLET | Freq: Four times a day (QID) | ORAL | 0 refills | Status: DC | PRN

## 2018-02-27 MED ORDER — DEXAMETHASONE SODIUM PHOSPHATE 10 MG/ML IJ SOLN
INTRAMUSCULAR | Status: DC | PRN
  Administered 2018-02-27: 4 mg via INTRAVENOUS

## 2018-02-27 MED ORDER — HYDROMORPHONE HCL 1 MG/ML IJ SOLN: 0.2500 mg | INTRAMUSCULAR | Status: DC | PRN

## 2018-02-27 MED ORDER — LACTATED RINGERS IV SOLN
INTRAVENOUS | Status: DC
  Administered 2018-02-27: 13:00:00 via INTRAVENOUS

## 2018-02-27 MED ORDER — FENTANYL CITRATE (PF) 100 MCG/2ML IJ SOLN
INTRAMUSCULAR | Status: DC | PRN
  Administered 2018-02-27: 50 ug via INTRAVENOUS
  Administered 2018-02-27 (×2): 12.5 ug via INTRAVENOUS

## 2018-02-27 SURGICAL SUPPLY — 40 items
BANDAGE ELASTIC 2 LF NS (GAUZE/BANDAGES/DRESSINGS) ×4 IMPLANT
BANDAGE ELASTIC 3 LF NS (GAUZE/BANDAGES/DRESSINGS) ×4 IMPLANT
BNDG COHESIVE 4X5 TAN STRL (GAUZE/BANDAGES/DRESSINGS) ×4 IMPLANT
BNDG ESMARK 4X12 TAN STRL LF (GAUZE/BANDAGES/DRESSINGS) ×4 IMPLANT
CANISTER SUCT 1200ML W/VALVE (MISCELLANEOUS) ×4 IMPLANT
CHLORAPREP W/TINT 26ML (MISCELLANEOUS) ×4 IMPLANT
CORD BIP STRL DISP 12FT (MISCELLANEOUS) ×4 IMPLANT
COVER LIGHT HANDLE UNIVERSAL (MISCELLANEOUS) ×8 IMPLANT
CUFF TOURNIQUET DUAL PORT 18X3 (MISCELLANEOUS) ×4 IMPLANT
DRAPE SURG 17X11 SM STRL (DRAPES) ×4 IMPLANT
DRAPE U-SHAPE 48X52 POLY STRL (PACKS) ×8 IMPLANT
ELECT REM PT RETURN 9FT ADLT (ELECTROSURGICAL) ×4
ELECTRODE REM PT RTRN 9FT ADLT (ELECTROSURGICAL) ×2 IMPLANT
GAUZE PETRO XEROFOAM 1X8 (MISCELLANEOUS) ×4 IMPLANT
GAUZE SPONGE 4X4 12PLY STRL (GAUZE/BANDAGES/DRESSINGS) ×4 IMPLANT
GLOVE BIO SURGEON STRL SZ7.5 (GLOVE) ×4 IMPLANT
GLOVE BIO SURGEON STRL SZ8 (GLOVE) ×4 IMPLANT
GLOVE INDICATOR 8.0 STRL GRN (GLOVE) ×4 IMPLANT
GOWN STRL REUS W/ TWL LRG LVL3 (GOWN DISPOSABLE) ×2 IMPLANT
GOWN STRL REUS W/ TWL XL LVL3 (GOWN DISPOSABLE) ×2 IMPLANT
GOWN STRL REUS W/TWL LRG LVL3 (GOWN DISPOSABLE) ×2
GOWN STRL REUS W/TWL XL LVL3 (GOWN DISPOSABLE) ×2
KIT CARPAL TUNNEL (MISCELLANEOUS) ×2
KIT ESCP INSRT D SLOT CANN KN (MISCELLANEOUS) ×2 IMPLANT
KIT TURNOVER KIT A (KITS) ×4 IMPLANT
LOOP VESSEL RED MINI 1.3X0.9 (MISCELLANEOUS) ×2 IMPLANT
LOOPS RED MINI 1.3MMX0.9MM (MISCELLANEOUS) ×2
NS IRRIG 500ML POUR BTL (IV SOLUTION) ×4 IMPLANT
PACK EXTREMITY ARMC (MISCELLANEOUS) ×4 IMPLANT
SLING ARM M TX990204 (SOFTGOODS) ×4 IMPLANT
SPLINT WRIST LG LT TX990309 (SOFTGOODS) ×4 IMPLANT
STOCKINETTE IMPERVIOUS 9X36 MD (GAUZE/BANDAGES/DRESSINGS) ×4 IMPLANT
STRAP BODY AND KNEE 60X3 (MISCELLANEOUS) ×4 IMPLANT
SUT PROLENE 4 0 PS 2 18 (SUTURE) ×8 IMPLANT
SUT VIC AB 0 CT1 36 (SUTURE) ×8 IMPLANT
SUT VIC AB 2-0 CT1 27 (SUTURE) ×2
SUT VIC AB 2-0 CT1 TAPERPNT 27 (SUTURE) ×2 IMPLANT
SUT VIC AB 2-0 CT2 27 (SUTURE) ×4 IMPLANT
SUT VIC AB 3-0 SH 27 (SUTURE) ×4
SUT VIC AB 3-0 SH 27X BRD (SUTURE) ×4 IMPLANT

## 2018-02-27 NOTE — Anesthesia Preprocedure Evaluation (Signed)
Anesthesia Evaluation  Patient identified by MRN, date of birth, ID band Patient awake    Reviewed: Allergy & Precautions, H&P , NPO status , Patient's Chart, lab work & pertinent test results  History of Anesthesia Complications Negative for: history of anesthetic complications  Airway Mallampati: I  TM Distance: >3 FB Neck ROM: full    Dental no notable dental hx.    Pulmonary neg pulmonary ROS,    Pulmonary exam normal breath sounds clear to auscultation       Cardiovascular hypertension, On Medications + CAD  Normal cardiovascular exam Rhythm:regular Rate:Normal     Neuro/Psych Anxiety Depression negative neurological ROS     GI/Hepatic Neg liver ROS, Medicated,  Endo/Other  negative endocrine ROS  Renal/GU negative Renal ROS  negative genitourinary   Musculoskeletal   Abdominal   Peds  Hematology negative hematology ROS (+)   Anesthesia Other Findings   Reproductive/Obstetrics                             Anesthesia Physical Anesthesia Plan  ASA: II  Anesthesia Plan: General   Post-op Pain Management: GA combined w/ Regional for post-op pain   Induction:   PONV Risk Score and Plan:   Airway Management Planned:   Additional Equipment:   Intra-op Plan:   Post-operative Plan:   Informed Consent: I have reviewed the patients History and Physical, chart, labs and discussed the procedure including the risks, benefits and alternatives for the proposed anesthesia with the patient or authorized representative who has indicated his/her understanding and acceptance.     Plan Discussed with:   Anesthesia Plan Comments:         Anesthesia Quick Evaluation

## 2018-02-27 NOTE — Anesthesia Postprocedure Evaluation (Signed)
Anesthesia Post Note  Patient: Tina Hubbard  Procedure(s) Performed: CARPAL TUNNEL RELEASE (Left Wrist) SUBCUTANEOUS ANTERIOR TRANSPOSITION OF TH EULNAR NERVE AT THE ELBOW (Left Elbow)  Patient location during evaluation: PACU Anesthesia Type: General Level of consciousness: awake and alert Pain management: pain level controlled Vital Signs Assessment: post-procedure vital signs reviewed and stable Respiratory status: spontaneous breathing Cardiovascular status: blood pressure returned to baseline and stable Anesthetic complications: no    Mikena Masoner, III,  Rosella Crandell D

## 2018-02-27 NOTE — Anesthesia Procedure Notes (Signed)
Anesthesia Regional Block: Supraclavicular block   Pre-Anesthetic Checklist: ,, timeout performed, Correct Patient, Correct Site, Correct Laterality, Correct Procedure, Correct Position, site marked, Risks and benefits discussed,  Surgical consent,  Pre-op evaluation,  At surgeon's request and post-op pain management  Laterality: Left  Prep: chloraprep       Needles:  Injection technique: Single-shot  Needle Type: Echogenic Stimulator Needle      Needle Gauge: 21     Additional Needles:   Procedures:, nerve stimulator,,, ultrasound used (permanent image in chart),,,,  Narrative:  Start time: 02/27/2018 12:20 PM End time: 02/27/2018 12:35 PM Injection made incrementally with aspirations every 5 mL.  Performed by: Personally  Anesthesiologist: Elgie Collard, MD  Additional Notes: Functioning IV was confirmed and monitors applied.  Sterile prep and drape,hand hygiene and sterile gloves were used.Ultrasound guidance: relevant anatomy identified, needle position confirmed, local anesthetic spread visualized around nerve(s)., vascular puncture avoided.  Image printed for medical record.  Negative aspiration and negative test dose prior to incremental administration of local anesthetic. The patient tolerated the procedure well. Vitals signes recorded in RN notes.

## 2018-02-27 NOTE — Anesthesia Procedure Notes (Signed)
Procedure Name: LMA Insertion Date/Time: 02/27/2018 1:32 PM Performed by: Mayme Genta, CRNA Pre-anesthesia Checklist: Patient identified, Emergency Drugs available, Suction available, Timeout performed and Patient being monitored Patient Re-evaluated:Patient Re-evaluated prior to induction Oxygen Delivery Method: Circle system utilized Preoxygenation: Pre-oxygenation with 100% oxygen Induction Type: IV induction LMA: LMA inserted LMA Size: 4.0 Number of attempts: 1 Placement Confirmation: positive ETCO2 and breath sounds checked- equal and bilateral Tube secured with: Tape

## 2018-02-27 NOTE — Discharge Instructions (Addendum)
General Anesthesia, Adult, Care After These instructions provide you with information about caring for yourself after your procedure. Your health care provider may also give you more specific instructions. Your treatment has been planned according to current medical practices, but problems sometimes occur. Call your health care provider if you have any problems or questions after your procedure. What can I expect after the procedure? After the procedure, it is common to have:  Vomiting.  A sore throat.  Mental slowness.  It is common to feel:  Nauseous.  Cold or shivery.  Sleepy.  Tired.  Sore or achy, even in parts of your body where you did not have surgery.  Follow these instructions at home: For at least 24 hours after the procedure:  Do not: ? Participate in activities where you could fall or become injured. ? Drive. ? Use heavy machinery. ? Drink alcohol. ? Take sleeping pills or medicines that cause drowsiness. ? Make important decisions or sign legal documents. ? Take care of children on your own.  Rest. Eating and drinking  If you vomit, drink water, juice, or soup when you can drink without vomiting.  Drink enough fluid to keep your urine clear or pale yellow.  Make sure you have little or no nausea before eating solid foods.  Follow the diet recommended by your health care provider. General instructions  Have a responsible adult stay with you until you are awake and alert.  Return to your normal activities as told by your health care provider. Ask your health care provider what activities are safe for you.  Take over-the-counter and prescription medicines only as told by your health care provider.  If you smoke, do not smoke without supervision.  Keep all follow-up visits as told by your health care provider. This is important. Contact a health care provider if:  You continue to have nausea or vomiting at home, and medicines are not helpful.  You  cannot drink fluids or start eating again.  You cannot urinate after 8-12 hours.  You develop a skin rash.  You have fever.  You have increasing redness at the site of your procedure. Get help right away if:  You have difficulty breathing.  You have chest pain.  You have unexpected bleeding.  You feel that you are having a life-threatening or urgent problem. This information is not intended to replace advice given to you by your health care provider. Make sure you discuss any questions you have with your health care provider. Document Released: 06/19/2000 Document Revised: 08/16/2015 Document Reviewed: 02/25/2015 Elsevier Interactive Patient Education  2018 [place] discharge instructions: Keep dressing dry and intact. Keep hand elevated above heart level. May shower after dressing removed on postop day 4 (Sunday). Cover sutures with Band-Aids after drying off. Apply ice to affected area frequently. Take Celebrex daily OR ibuprofen 600-800 mg TID with meals for 7-10 days, then as necessary. Take ES Tylenol or pain medication as prescribed when needed.  Return for follow-up in 10-14 days or as scheduled.

## 2018-02-27 NOTE — Op Note (Signed)
02/27/2018  2:55 PM  Patient:   Tina Hubbard  Pre-Op Diagnosis:   1. Recurrent left carpal tunnel syndrome.  2. Left cubital tunnel syndrome.  Post-Op Diagnosis:   Same.  Procedure:   1. Open left carpal tunnel release.  2.  Anterior subcutaneous transposition of ulnar nerve, left elbow.  Surgeon:   Pascal Lux, MD  Anesthesia:   General LMA with interscalene block placed preoperatively by anesthesiologist  Findings:   As above.  Complications:   None  EBL:   3 cc  Fluids:   800 cc crystalloid  TT:   79 minutes at 250 mmHg  Drains:   None  Closure:   4-0 Prolene interrupted sutures  Brief Clinical Note:   The patient is a 72 year old female with a history of pain and paresthesias to her entire left hand. The patient is 30 years status post an open carpal tunnel release and 1 year status post release of the ulnar nerve at Guyon's canal. Over the past 6 months, she has developed worsening pain and paresthesias to the left forearm and hand. An EMG/NCV has confirmed the presence of recurrent carpal tunnel syndrome as well as compression of the ulnar nerve at the left elbow assistant with cubital tunnel syndrome. The patient presents at this time for an open left carpal tunnel release as well as a subcutaneous anterior transposition of the ulnar nerve at the left elbow.   Procedure:   The patient underwent placement of an interscalene block in the preoperative holding area before she was brought into the operating room and lain in the supine position. After adequate general laryngeal mask anesthesia was achieved, the left hand and upper extremity were prepped with ChloraPrep solution before being draped sterilely. Preoperative antibiotics were administered. A timeout was performed to verify the appropriate surgical site before the limb was exsanguinated with an Esmarch and the sterile tourniquet inflated to 250 mmHg. The current carpal tunnel syndrome symptoms were addressed first.  Utilizing the previous incision on the volar aspect of the palm, the incision was extended proximally in a zigzag fashion over the wrist flexor crease into the distal forearm. The incision was carried down through the subcutaneous tissues with care taken to identify and protect any neurovascular structures. The distal forearm fascia was penetrated just proximal to the transverse carpal ligament. The soft tissues were released off the superficial and deep surfaces of the distal forearm fascia and this was released proximally for 2-3 cm under direct visualization. The nerve was visualized directly before a Soil scientist was passed beneath the transverse carpal ligament along the ulnar aspect of the carpal tunnel and used to release any adhesions as well as to remove any adherent synovial tissue. It also was used to protect the underlying nerve as the recurrent scar tissue and residual transverse carpal ligament was transected in line with the incision. Several adhesions along the ulnar side of the nerve were released to allow the nerve to move more freely within the carpal tunnel. Hemostasis was achieved using bipolar electrocautery.  The wound was irrigated thoroughly with sterile saline solution before being closed using 4-0 Prolene interrupted sutures. A total of 10 cc of 0.5% plain Sensorcaine was injected in and around the incision to help with postoperative analgesia.   Next, attention was directed toward the elbow. An approximately 7-8 cm curvilinear incision was made along the course of the ulnar nerve posterior to the medial epicondyle. The incision was carried down through the subcutaneous tissues with  care taken to avoid the small branches of the medial antebrachial nerve to expose the sheath overlying the cubital tunnel. The ulnar nerve was identified at the proximal end of the tunnel and was dissected free. The nerve was then carefully followed as the roof of the cubital tunnel was released from  proximal to distal. Distally, the fascia overlying the pronator muscle was released for several centimeters. The nerve was clearly thickened just proximal to the pronator fascia, indicating the most likely source of the nerve compression. A vessel loop was passed around the nerve and used to provide gentle traction on the nerve while circumferential dissection was carried out under loupe magnification using bipolar electrocautery and tenotomy scissors.   Once the nerve was fully mobilized, the anterior tissues were elevated as a flap just superficial to the fascia overlying the flexor wad and a pocket created to accept the nerve. Care was taken to be sure that there was no undue tension along the nerve either proximally or distally. The nerve was carefully retracted while several #0 Vicryl interrupted sutures were placed to reapproximate the flap to the medial epicondylar soft tissues, thereby creating a "sling" for the ulnar nerve. The cubital tunnel itself was reapproximated using several #0 Vicryl interrupted sutures in order to prevent the nerve from falling back into the cubital tunnel.   The wound was copiously irrigated with sterile saline solution before the subcutaneous tissues were closed using 2-0 Vicryl interrupted sutures. The skin was closed using 3-0 Vicryl subcuticular sutures before benzoin and Steri-Strips were applied to the skin. A total of 10 cc of 0.5% plain Sensorcaine was injected in and around the incision to help with postoperative analgesia. A sterile bulky dressing was applied to the left elbow.  In addition, a sterile bulky dressing was applied to the left wrist/hand wound before applying a volar wrist splint.  The patient's left upper extremity was placed into a sling before she was awakened, extubated, and returned to the recovery room in satisfactory condition after tolerating the procedures well.

## 2018-02-27 NOTE — H&P (Signed)
Paper H&P to be scanned into permanent record. H&P reviewed and patient re-examined. No changes. 

## 2018-02-27 NOTE — Anesthesia Procedure Notes (Signed)
Performed by: Avannah Decker, CRNA Pre-anesthesia Checklist: Patient identified, Emergency Drugs available, Suction available, Timeout performed and Patient being monitored Patient Re-evaluated:Patient Re-evaluated prior to induction Oxygen Delivery Method: Nasal cannula Placement Confirmation: positive ETCO2       

## 2018-02-27 NOTE — Transfer of Care (Signed)
Immediate Anesthesia Transfer of Care Note  Patient: Tina Hubbard  Procedure(s) Performed: CARPAL TUNNEL RELEASE (Left Wrist) SUBCUTANEOUS ANTERIOR TRANSPOSITION OF TH EULNAR NERVE AT THE ELBOW (Left Elbow)  Patient Location: PACU  Anesthesia Type: General  Level of Consciousness: awake, alert  and patient cooperative  Airway and Oxygen Therapy: Patient Spontanous Breathing and Patient connected to supplemental oxygen  Post-op Assessment: Post-op Vital signs reviewed, Patient's Cardiovascular Status Stable, Respiratory Function Stable, Patent Airway and No signs of Nausea or vomiting  Post-op Vital Signs: Reviewed and stable  Complications: No apparent anesthesia complications

## 2018-02-28 ENCOUNTER — Encounter: Payer: Self-pay | Admitting: Surgery

## 2018-04-22 ENCOUNTER — Other Ambulatory Visit (HOSPITAL_COMMUNITY): Payer: Self-pay | Admitting: Family Medicine

## 2018-04-22 ENCOUNTER — Other Ambulatory Visit: Payer: Self-pay | Admitting: Family Medicine

## 2018-04-22 DIAGNOSIS — M5442 Lumbago with sciatica, left side: Principal | ICD-10-CM

## 2018-04-22 DIAGNOSIS — G8929 Other chronic pain: Secondary | ICD-10-CM

## 2018-04-30 ENCOUNTER — Ambulatory Visit
Admission: RE | Admit: 2018-04-30 | Discharge: 2018-04-30 | Disposition: A | Discharge status: Home / Self Care | Payer: Medicare Other | Source: Ambulatory Visit | Attending: Family Medicine | Admitting: Family Medicine

## 2018-04-30 DIAGNOSIS — M5442 Lumbago with sciatica, left side: Secondary | ICD-10-CM | POA: Diagnosis present

## 2018-04-30 DIAGNOSIS — G8929 Other chronic pain: Secondary | ICD-10-CM | POA: Diagnosis present

## 2018-05-03 ENCOUNTER — Other Ambulatory Visit: Payer: Self-pay | Admitting: Neurosurgery

## 2018-05-03 DIAGNOSIS — IMO0002 Reserved for concepts with insufficient information to code with codable children: Secondary | ICD-10-CM

## 2018-05-03 DIAGNOSIS — M4800 Spinal stenosis, site unspecified: Principal | ICD-10-CM

## 2018-05-15 ENCOUNTER — Ambulatory Visit
Admission: RE | Admit: 2018-05-15 | Discharge: 2018-05-15 | Disposition: A | Discharge status: Home / Self Care | Payer: Medicare Other | Source: Ambulatory Visit | Attending: Neurosurgery | Admitting: Neurosurgery

## 2018-05-15 DIAGNOSIS — M4800 Spinal stenosis, site unspecified: Secondary | ICD-10-CM | POA: Insufficient documentation

## 2018-05-15 DIAGNOSIS — IMO0002 Reserved for concepts with insufficient information to code with codable children: Secondary | ICD-10-CM

## 2018-06-10 ENCOUNTER — Encounter
Admission: RE | Admit: 2018-06-10 | Discharge: 2018-06-10 | Disposition: A | Discharge status: Home / Self Care | Payer: Medicare Other | Source: Ambulatory Visit | Attending: Neurosurgery | Admitting: Neurosurgery

## 2018-06-10 ENCOUNTER — Other Ambulatory Visit: Payer: Self-pay

## 2018-06-10 DIAGNOSIS — Z01818 Encounter for other preprocedural examination: Secondary | ICD-10-CM | POA: Diagnosis present

## 2018-06-10 DIAGNOSIS — Z01810 Encounter for preprocedural cardiovascular examination: Secondary | ICD-10-CM | POA: Diagnosis not present

## 2018-06-10 LAB — BASIC METABOLIC PANEL
Anion gap: 10 (ref 5–15)
BUN: 19 mg/dL (ref 8–23)
CO2: 26 mmol/L (ref 22–32)
Calcium: 9.5 mg/dL (ref 8.9–10.3)
Chloride: 104 mmol/L (ref 98–111)
Creatinine, Ser: 0.71 mg/dL (ref 0.44–1.00)
GFR calc Af Amer: >60 mL/min (ref >60)
GFR calc non Af Amer: >60 mL/min (ref >60)
Glucose, Bld: 170 mg/dL — ABNORMAL HIGH (ref 70–99)
Potassium: 3.5 mmol/L (ref 3.5–5.1)
Sodium: 140 mmol/L (ref 135–145)

## 2018-06-10 LAB — URINALYSIS, ROUTINE W REFLEX MICROSCOPIC
BILIRUBIN URINE: NEGATIVE
Glucose, UA: NEGATIVE mg/dL
Hgb urine dipstick: NEGATIVE
Ketones, ur: NEGATIVE mg/dL
Leukocytes,Ua: NEGATIVE
NITRITE: NEGATIVE
Protein, ur: NEGATIVE mg/dL
Specific Gravity, Urine: 1.017 (ref 1.005–1.030)
pH: 5 (ref 5.0–8.0)

## 2018-06-10 LAB — TYPE AND SCREEN
ABO/RH(D): A POS
Antibody Screen: NEGATIVE

## 2018-06-10 LAB — CBC
HCT: 40.1 % (ref 36.0–46.0)
Hemoglobin: 13.5 g/dL (ref 12.0–15.0)
MCH: 30.3 pg (ref 26.0–34.0)
MCHC: 33.7 g/dL (ref 30.0–36.0)
MCV: 89.9 fL (ref 80.0–100.0)
Platelets: 304 10*3/uL (ref 150–400)
RBC: 4.46 MIL/uL (ref 3.87–5.11)
RDW: 12 % (ref 11.5–15.5)
WBC: 5.8 10*3/uL (ref 4.0–10.5)
nRBC: 0 % (ref 0.0–0.2)

## 2018-06-10 LAB — SURGICAL PCR SCREEN
MRSA, PCR: NEGATIVE
Staphylococcus aureus: POSITIVE — AB

## 2018-06-10 LAB — PROTIME-INR
INR: 0.9 (ref 0.8–1.2)
PROTHROMBIN TIME: 12.2 s (ref 11.4–15.2)

## 2018-06-10 LAB — APTT: aPTT: 28 seconds (ref 24–36)

## 2018-06-10 NOTE — Patient Instructions (Signed)
  Your procedure is scheduled on: Wednesday June 19, 2018  Report to Same Day Surgery 2nd floor [facility] Entrance-take elevator on left to 2nd floor.  Check in with surgery information desk.) To find out your arrival time, call [phone] 1:00-3:00 PM on Tuesday June 18, 2018  Remember: Instructions that are not followed completely may result in serious medical risk, up to and including death, or upon the discretion of your surgeon and anesthesiologist your surgery may need to be rescheduled.    __x__ 1. Do not eat food (including mints, candies, chewing gum) after midnight the night before your procedure. You may drink water up to 2 hours before you are scheduled to arrive at the hospital for your procedure.  Do not drink anything within 2 hours of your scheduled arrival to the hospital.    __x__ 2. No Alcohol for 24 hours before or after surgery.   __x__ 3. No Smoking or e-cigarettes for 24 hours before surgery.  Do not use any chewable tobacco products for at least 6 hours before surgery.   __x__ 4. Notify your doctor if there is any change in your medical condition (cold, fever, infections).   __x__ 5. On the morning of surgery brush your teeth with toothpaste and water.  You may rinse your mouth with mouthwash if you wish.  Do not swallow any toothpaste or mouthwash.  Please read over the following fact sheets that you were given:   [facility] Preparing for Surgery and/or MRSA Information    __x__ Use CHG Soap or Sage wipes as directed on instruction sheet.   Do not wear jewelry, make-up, hairpins, clips or nail polish on the day of surgery.  Do not wear lotions, powders, deodorant, or perfumes.   Do not shave below the face/neck 48 hours prior to surgery.   Do not bring valuables to the hospital.    [facility] is not responsible for any belongings or valuables.               Contacts, dentures or bridgework may not be worn into surgery.  Leave your  suitcase in the car. After surgery it may be brought to your room.  For patients admitted to the hospital, discharge time is determined by your treatment team.  For patients discharged on the day of surgery, you will NOT be permitted to drive yourself home.  You must have a responsible adult with you for 24 hours after surgery.  __x__ Take these medicines on the morning of surgery with a SMALL SIP OF WATER:  1. Amlodipine/Norvasc  2. Atorvastatin/Lipitor  3. Buspirone/Buspar  4. Duloxetine/Cymbalta  5. Gabapentin/Neurontin  6. Omeprazole/Prilosec  Do not take your Valsartan/HCTZ on the morning of surgery.  __x__ Follow recommendations from Cardiologist, Pulmonologist or PCP regarding stopping Aspirin, Coumadin, Plavix, Eliquis, Effient, Pradaxa, and Pletal.  __x__ TODAY: Stop Anti-inflammatories such as Advil, Ibuprofen, Motrin, Aleve, Naproxen, Naprosyn, BC/Goodies powders or aspirin products. You may continue to take Tylenol and Celebrex.   __x__ TODAY: Stop over the counter supplements until after surgery. You may continue to take Vitamin D, Vitamin B, and multivitamin.

## 2018-08-26 ENCOUNTER — Other Ambulatory Visit: Payer: Self-pay | Admitting: Family Medicine

## 2018-08-26 DIAGNOSIS — Z1231 Encounter for screening mammogram for malignant neoplasm of breast: Secondary | ICD-10-CM

## 2018-08-30 ENCOUNTER — Other Ambulatory Visit: Payer: Self-pay

## 2018-08-30 ENCOUNTER — Encounter
Admission: RE | Admit: 2018-08-30 | Discharge: 2018-08-30 | Disposition: A | Discharge status: Home / Self Care | Payer: Medicare Other | Source: Ambulatory Visit | Attending: Neurosurgery | Admitting: Neurosurgery

## 2018-08-30 DIAGNOSIS — Z1159 Encounter for screening for other viral diseases: Secondary | ICD-10-CM | POA: Insufficient documentation

## 2018-08-30 DIAGNOSIS — Z01812 Encounter for preprocedural laboratory examination: Secondary | ICD-10-CM | POA: Insufficient documentation

## 2018-08-30 LAB — TYPE AND SCREEN
ABO/RH(D): A POS
Antibody Screen: NEGATIVE

## 2018-08-30 NOTE — Patient Instructions (Signed)
Your procedure is scheduled on: Wed 09/04/18 Report to [facility]. To find out your arrival time please call [phone] between 1PM - 3PM on Tues 09/03/18.  Remember: Instructions that are not followed completely may result in serious medical risk, up to and including death, or upon the discretion of your surgeon and anesthesiologist your surgery may need to be rescheduled.     _X__ 1. Do not eat food after midnight the night before your procedure.                 No gum chewing or hard candies. You may drink clear liquids up to 2 hours                 before you are scheduled to arrive for your surgery- DO not drink clear                 liquids within 2 hours of the start of your surgery.                 Clear Liquids include:  water, apple juice without pulp, clear carbohydrate                 drink such as Clearfast or Gatorade, Black Coffee or Tea (Do not add                 anything to coffee or tea).  __X__2.  On the morning of surgery brush your teeth with toothpaste and water, you                 may rinse your mouth with mouthwash if you wish.  Do not swallow any              toothpaste of mouthwash.     _X__ 3.  No Alcohol for 24 hours before or after surgery.   _X__ 4.  Do Not Smoke or use e-cigarettes For 24 Hours Prior to Your Surgery.                 Do not use any chewable tobacco products for at least 6 hours prior to                 surgery.  ____  5.  Bring all medications with you on the day of surgery if instructed.   __X__  6.  Notify your doctor if there is any change in your medical condition      (cold, fever, infections).     Do not wear jewelry, make-up, hairpins, clips or nail polish. Do not wear lotions, powders, or perfumes.  Do not shave 48 hours prior to surgery. Men may shave face and neck. Do not bring valuables to the hospital.    [facility] is not responsible for any belongings or  valuables.  Contacts, dentures/partials or body piercings may not be worn into surgery. Bring a case for your contacts, glasses or hearing aids, a denture cup will be supplied. Leave your suitcase in the car. After surgery it may be brought to your room. For patients admitted to the hospital, discharge time is determined by your treatment team.   Patients discharged the day of surgery will not be allowed to drive home.   Please read over the following fact sheets that you were given:   MRSA Information  __X__ Take these medicines the morning of surgery with a sip of water  1. amLODipine (  NORVASC)  2. atorvastatin (LIPITOR)  3. busPIRone (BUSPAR)  4. DULoxetine (CYMBALTA)   5. gabapentin (NEURONTIN)  6. omeprazole (PRILOSEC)  ____ Fleet Enema (as directed)   __X__ Use CHG Soap/SAGE wipes as directed  ____ Use inhalers on the day of surgery  ____ Stop metformin/Janumet/Farxiga 2 days prior to surgery    ____ Take 1/2 of usual insulin dose the night before surgery. No insulin the morning          of surgery.   ____ Stop Blood Thinners Coumadin/Plavix/Xarelto/Pleta/Pradaxa/Eliquis/Effient/Aspirin  on   Or contact your Surgeon, Cardiologist or Medical Doctor regarding  ability to stop your blood thinners  __X__ Stop Anti-inflammatories 7 days before surgery such as Advil, Ibuprofen, Motrin,  BC or Goodies Powder, Naprosyn, Naproxen, Aleve, Aspirin    __X__ Stop all herbal supplements, fish oil or vitamin E until after surgery.    ____ Bring C-Pap to the hospital.

## 2018-08-31 LAB — NOVEL CORONAVIRUS, NAA (HOSP ORDER, SEND-OUT TO REF LAB; TAT 18-24 HRS): SARS-CoV-2, NAA: NOT DETECTED

## 2018-09-03 ENCOUNTER — Encounter: Payer: Self-pay | Admitting: Anesthesiology

## 2018-09-04 ENCOUNTER — Inpatient Hospital Stay: Payer: Medicare Other | Admitting: Anesthesiology

## 2018-09-04 ENCOUNTER — Other Ambulatory Visit: Payer: Self-pay

## 2018-09-04 ENCOUNTER — Inpatient Hospital Stay
Admission: RE | Admit: 2018-09-04 | Discharge: 2018-09-06 | DRG: 455 | Disposition: A | Discharge status: Home Health | Payer: Medicare Other | Attending: Neurosurgery | Admitting: Neurosurgery

## 2018-09-04 ENCOUNTER — Encounter
Admission: RE | Disposition: A | Discharge status: Home Health | Payer: Self-pay | Source: Home / Self Care | Attending: Neurosurgery

## 2018-09-04 ENCOUNTER — Inpatient Hospital Stay: Payer: Medicare Other

## 2018-09-04 ENCOUNTER — Encounter: Payer: Self-pay | Admitting: *Deleted

## 2018-09-04 DIAGNOSIS — Z981 Arthrodesis status: Secondary | ICD-10-CM | POA: Diagnosis not present

## 2018-09-04 DIAGNOSIS — M5116 Intervertebral disc disorders with radiculopathy, lumbar region: Secondary | ICD-10-CM | POA: Diagnosis present

## 2018-09-04 DIAGNOSIS — M48061 Spinal stenosis, lumbar region without neurogenic claudication: Secondary | ICD-10-CM | POA: Diagnosis present

## 2018-09-04 DIAGNOSIS — E785 Hyperlipidemia, unspecified: Secondary | ICD-10-CM | POA: Diagnosis present

## 2018-09-04 DIAGNOSIS — L4050 Arthropathic psoriasis, unspecified: Secondary | ICD-10-CM | POA: Diagnosis present

## 2018-09-04 DIAGNOSIS — I10 Essential (primary) hypertension: Secondary | ICD-10-CM | POA: Diagnosis present

## 2018-09-04 DIAGNOSIS — Z833 Family history of diabetes mellitus: Secondary | ICD-10-CM

## 2018-09-04 DIAGNOSIS — F329 Major depressive disorder, single episode, unspecified: Secondary | ICD-10-CM | POA: Diagnosis present

## 2018-09-04 DIAGNOSIS — Z8249 Family history of ischemic heart disease and other diseases of the circulatory system: Secondary | ICD-10-CM | POA: Diagnosis not present

## 2018-09-04 DIAGNOSIS — R7303 Prediabetes: Secondary | ICD-10-CM | POA: Diagnosis present

## 2018-09-04 DIAGNOSIS — Z96653 Presence of artificial knee joint, bilateral: Secondary | ICD-10-CM | POA: Diagnosis present

## 2018-09-04 DIAGNOSIS — Z419 Encounter for procedure for purposes other than remedying health state, unspecified: Secondary | ICD-10-CM

## 2018-09-04 HISTORY — PX: POSTERIOR LUMBAR FUSION 4 LEVEL: SHX6037

## 2018-09-04 LAB — GLUCOSE, CAPILLARY
Glucose-Capillary: 107 mg/dL — ABNORMAL HIGH (ref 70–99)
Glucose-Capillary: 140 mg/dL — ABNORMAL HIGH (ref 70–99)

## 2018-09-04 SURGERY — POSTERIOR LUMBAR FUSION 4 LEVEL
Anesthesia: General | Site: Back

## 2018-09-04 MED ORDER — HYDROMORPHONE HCL 1 MG/ML IJ SOLN: 0.5000 mg | INTRAMUSCULAR | Status: DC | PRN

## 2018-09-04 MED ORDER — LIDOCAINE HCL (PF) 2 % IJ SOLN
INTRAMUSCULAR | Status: AC
  Filled 2018-09-04: qty 10

## 2018-09-04 MED ORDER — METHOCARBAMOL 500 MG PO TABS
750.0000 mg | ORAL_TABLET | Freq: Four times a day (QID) | ORAL | Status: DC
  Administered 2018-09-04 – 2018-09-06 (×5): 750 mg via ORAL
  Filled 2018-09-04 (×3): qty 2
  Filled 2018-09-04: qty 1
  Filled 2018-09-04 (×2): qty 2

## 2018-09-04 MED ORDER — OXYCODONE HCL 5 MG PO TABS
10.0000 mg | ORAL_TABLET | ORAL | Status: DC | PRN
  Administered 2018-09-04: 10 mg via ORAL
  Filled 2018-09-04 (×2): qty 2

## 2018-09-04 MED ORDER — CEFAZOLIN SODIUM-DEXTROSE 2-4 GM/100ML-% IV SOLN
INTRAVENOUS | Status: AC
  Filled 2018-09-04: qty 100

## 2018-09-04 MED ORDER — BUSPIRONE HCL 10 MG PO TABS
10.0000 mg | ORAL_TABLET | Freq: Every day | ORAL | Status: DC
  Administered 2018-09-04 – 2018-09-06 (×3): 10 mg via ORAL
  Filled 2018-09-04 (×3): qty 1

## 2018-09-04 MED ORDER — ONDANSETRON HCL 4 MG/2ML IJ SOLN
INTRAMUSCULAR | Status: AC
  Filled 2018-09-04: qty 2

## 2018-09-04 MED ORDER — HYDROMORPHONE HCL 1 MG/ML IJ SOLN
0.5000 mg | INTRAMUSCULAR | Status: AC | PRN
  Administered 2018-09-04 (×4): 0.5 mg via INTRAVENOUS

## 2018-09-04 MED ORDER — FENTANYL CITRATE (PF) 100 MCG/2ML IJ SOLN
INTRAMUSCULAR | Status: DC | PRN
  Administered 2018-09-04 (×2): 50 ug via INTRAVENOUS
  Administered 2018-09-04: 100 ug via INTRAVENOUS

## 2018-09-04 MED ORDER — SODIUM CHLORIDE 0.9 % IV SOLN: 250.0000 mL | INTRAVENOUS | Status: DC

## 2018-09-04 MED ORDER — DULOXETINE HCL 60 MG PO CPEP
60.0000 mg | ORAL_CAPSULE | Freq: Every day | ORAL | Status: DC
  Administered 2018-09-05 – 2018-09-06 (×2): 60 mg via ORAL
  Filled 2018-09-04 (×2): qty 1

## 2018-09-04 MED ORDER — KETOROLAC TROMETHAMINE 30 MG/ML IJ SOLN
30.0000 mg | Freq: Once | INTRAMUSCULAR | Status: AC
  Administered 2018-09-04: 18:00:00 30 mg via INTRAVENOUS

## 2018-09-04 MED ORDER — LORAZEPAM 2 MG/ML IJ SOLN
INTRAMUSCULAR | Status: AC
  Administered 2018-09-04: 17:00:00 0.5 mg via INTRAVENOUS
  Filled 2018-09-04: qty 1

## 2018-09-04 MED ORDER — SODIUM CHLORIDE 0.9 % IV SOLN
INTRAVENOUS | Status: DC | PRN
  Administered 2018-09-04: .05 ug/kg/min via INTRAVENOUS

## 2018-09-04 MED ORDER — PROPOFOL 500 MG/50ML IV EMUL
INTRAVENOUS | Status: AC
  Filled 2018-09-04: qty 50

## 2018-09-04 MED ORDER — FENTANYL CITRATE (PF) 100 MCG/2ML IJ SOLN: 25.0000 ug | INTRAMUSCULAR | Status: DC | PRN

## 2018-09-04 MED ORDER — VALSARTAN-HYDROCHLOROTHIAZIDE 320-12.5 MG PO TABS: 1.0000 | ORAL_TABLET | Freq: Every day | ORAL | Status: DC

## 2018-09-04 MED ORDER — ROCURONIUM BROMIDE 100 MG/10ML IV SOLN
INTRAVENOUS | Status: DC | PRN
  Administered 2018-09-04: 10 mg via INTRAVENOUS

## 2018-09-04 MED ORDER — BISACODYL 5 MG PO TBEC: 5.0000 mg | DELAYED_RELEASE_TABLET | Freq: Every day | ORAL | Status: DC | PRN

## 2018-09-04 MED ORDER — ACETAMINOPHEN 650 MG RE SUPP: 650.0000 mg | RECTAL | Status: DC | PRN

## 2018-09-04 MED ORDER — ATORVASTATIN CALCIUM 20 MG PO TABS
20.0000 mg | ORAL_TABLET | Freq: Every day | ORAL | Status: DC
  Administered 2018-09-05 – 2018-09-06 (×2): 20 mg via ORAL
  Filled 2018-09-04 (×2): qty 1

## 2018-09-04 MED ORDER — VANCOMYCIN HCL 1.25 G IV SOLR
1250.0000 mg | Freq: Once | INTRAVENOUS | Status: AC
  Administered 2018-09-04: 60 mg via INTRAVENOUS
  Administered 2018-09-04: 13:00:00 1250 mg via INTRAVENOUS
  Filled 2018-09-04: qty 1250

## 2018-09-04 MED ORDER — ROCURONIUM BROMIDE 50 MG/5ML IV SOLN
INTRAVENOUS | Status: AC
  Filled 2018-09-04: qty 1

## 2018-09-04 MED ORDER — LACTATED RINGERS IV SOLN
INTRAVENOUS | Status: DC
  Administered 2018-09-04: 12:00:00 via INTRAVENOUS

## 2018-09-04 MED ORDER — MAGNESIUM CITRATE PO SOLN
1.0000 | Freq: Once | ORAL | Status: DC | PRN
  Filled 2018-09-04: qty 296

## 2018-09-04 MED ORDER — SENNA 8.6 MG PO TABS
1.0000 | ORAL_TABLET | Freq: Two times a day (BID) | ORAL | Status: DC
  Administered 2018-09-04 – 2018-09-06 (×4): 8.6 mg via ORAL
  Filled 2018-09-04 (×4): qty 1

## 2018-09-04 MED ORDER — ACETAMINOPHEN 500 MG PO TABS
1000.0000 mg | ORAL_TABLET | Freq: Four times a day (QID) | ORAL | Status: AC
  Administered 2018-09-04 – 2018-09-05 (×3): 1000 mg via ORAL
  Filled 2018-09-04 (×4): qty 2

## 2018-09-04 MED ORDER — ONDANSETRON HCL 4 MG/2ML IJ SOLN
INTRAMUSCULAR | Status: DC | PRN
  Administered 2018-09-04: 4 mg via INTRAVENOUS

## 2018-09-04 MED ORDER — KETOROLAC TROMETHAMINE 0.5 % OP SOLN
1.0000 [drp] | Freq: Three times a day (TID) | OPHTHALMIC | Status: DC
  Administered 2018-09-04 – 2018-09-06 (×5): 1 [drp] via OPHTHALMIC
  Filled 2018-09-04: qty 3

## 2018-09-04 MED ORDER — DEXAMETHASONE SODIUM PHOSPHATE 10 MG/ML IJ SOLN
INTRAMUSCULAR | Status: AC
  Filled 2018-09-04: qty 1

## 2018-09-04 MED ORDER — KETOROLAC TROMETHAMINE 15 MG/ML IJ SOLN
15.0000 mg | Freq: Four times a day (QID) | INTRAMUSCULAR | Status: AC
  Administered 2018-09-05 – 2018-09-06 (×5): 15 mg via INTRAVENOUS
  Filled 2018-09-04 (×4): qty 1

## 2018-09-04 MED ORDER — HYDROCHLOROTHIAZIDE 12.5 MG PO CAPS
12.5000 mg | ORAL_CAPSULE | Freq: Every day | ORAL | Status: DC
  Administered 2018-09-04 – 2018-09-06 (×3): 12.5 mg via ORAL
  Filled 2018-09-04 (×3): qty 1

## 2018-09-04 MED ORDER — LORAZEPAM 2 MG/ML IJ SOLN
0.5000 mg | Freq: Once | INTRAMUSCULAR | Status: AC
  Administered 2018-09-04: 17:00:00 0.5 mg via INTRAVENOUS

## 2018-09-04 MED ORDER — SUGAMMADEX SODIUM 200 MG/2ML IV SOLN
INTRAVENOUS | Status: AC
  Filled 2018-09-04: qty 2

## 2018-09-04 MED ORDER — PHENYLEPHRINE HCL (PRESSORS) 10 MG/ML IV SOLN
INTRAVENOUS | Status: DC | PRN
  Administered 2018-09-04 (×7): 100 ug via INTRAVENOUS
  Administered 2018-09-04: 50 ug via INTRAVENOUS
  Administered 2018-09-04 (×3): 100 ug via INTRAVENOUS

## 2018-09-04 MED ORDER — SODIUM CHLORIDE 0.9 % IR SOLN
Status: DC | PRN
  Administered 2018-09-04: 1000 mL

## 2018-09-04 MED ORDER — KETOROLAC TROMETHAMINE 30 MG/ML IJ SOLN
INTRAMUSCULAR | Status: AC
  Administered 2018-09-04: 18:00:00 30 mg via INTRAVENOUS
  Filled 2018-09-04: qty 1

## 2018-09-04 MED ORDER — ONDANSETRON HCL 4 MG/2ML IJ SOLN: 4.0000 mg | Freq: Four times a day (QID) | INTRAMUSCULAR | Status: DC | PRN

## 2018-09-04 MED ORDER — HYDROMORPHONE HCL 1 MG/ML IJ SOLN
INTRAMUSCULAR | Status: AC
  Administered 2018-09-04: 0.5 mg via INTRAVENOUS
  Filled 2018-09-04: qty 1

## 2018-09-04 MED ORDER — FENTANYL CITRATE (PF) 100 MCG/2ML IJ SOLN
INTRAMUSCULAR | Status: AC
  Filled 2018-09-04: qty 2

## 2018-09-04 MED ORDER — SUGAMMADEX SODIUM 200 MG/2ML IV SOLN
INTRAVENOUS | Status: DC | PRN
  Administered 2018-09-04: 100 mg via INTRAVENOUS

## 2018-09-04 MED ORDER — CEFAZOLIN SODIUM-DEXTROSE 2-4 GM/100ML-% IV SOLN
2.0000 g | Freq: Once | INTRAVENOUS | Status: AC
  Administered 2018-09-04: 2 g via INTRAVENOUS

## 2018-09-04 MED ORDER — PROPOFOL 10 MG/ML IV BOLUS
INTRAVENOUS | Status: DC | PRN
  Administered 2018-09-04: 150 mg via INTRAVENOUS

## 2018-09-04 MED ORDER — IRBESARTAN 150 MG PO TABS
300.0000 mg | ORAL_TABLET | Freq: Every day | ORAL | Status: DC
  Administered 2018-09-04 – 2018-09-06 (×3): 300 mg via ORAL
  Filled 2018-09-04 (×3): qty 2

## 2018-09-04 MED ORDER — SODIUM CHLORIDE 0.9 % IV SOLN
INTRAVENOUS | Status: DC
  Administered 2018-09-04: 20:00:00 via INTRAVENOUS

## 2018-09-04 MED ORDER — SODIUM CHLORIDE 0.9% FLUSH: 3.0000 mL | INTRAVENOUS | Status: DC | PRN

## 2018-09-04 MED ORDER — ONDANSETRON HCL 4 MG PO TABS: 4.0000 mg | ORAL_TABLET | Freq: Four times a day (QID) | ORAL | Status: DC | PRN

## 2018-09-04 MED ORDER — GABAPENTIN 300 MG PO CAPS
600.0000 mg | ORAL_CAPSULE | Freq: Two times a day (BID) | ORAL | Status: DC
  Administered 2018-09-04 – 2018-09-06 (×4): 600 mg via ORAL
  Filled 2018-09-04 (×4): qty 2

## 2018-09-04 MED ORDER — SUCCINYLCHOLINE CHLORIDE 20 MG/ML IJ SOLN
INTRAMUSCULAR | Status: DC | PRN
  Administered 2018-09-04: 100 mg via INTRAVENOUS

## 2018-09-04 MED ORDER — ESTRADIOL 1 MG PO TABS
1.0000 mg | ORAL_TABLET | Freq: Every day | ORAL | Status: DC
  Administered 2018-09-04 – 2018-09-06 (×3): 1 mg via ORAL
  Filled 2018-09-04 (×4): qty 1

## 2018-09-04 MED ORDER — AMLODIPINE BESYLATE 5 MG PO TABS
5.0000 mg | ORAL_TABLET | Freq: Every day | ORAL | Status: DC
  Administered 2018-09-05 – 2018-09-06 (×2): 5 mg via ORAL
  Filled 2018-09-04 (×2): qty 1

## 2018-09-04 MED ORDER — REMIFENTANIL HCL 1 MG IV SOLR
INTRAVENOUS | Status: AC
  Filled 2018-09-04: qty 1000

## 2018-09-04 MED ORDER — DIAZEPAM 5 MG PO TABS
5.0000 mg | ORAL_TABLET | Freq: Once | ORAL | Status: AC
  Administered 2018-09-04: 23:00:00 5 mg via ORAL
  Filled 2018-09-04: qty 1

## 2018-09-04 MED ORDER — BUPIVACAINE-EPINEPHRINE 0.5% -1:200000 IJ SOLN
INTRAMUSCULAR | Status: DC | PRN
  Administered 2018-09-04: 10 mL

## 2018-09-04 MED ORDER — PHENOL 1.4 % MT LIQD
1.0000 | OROMUCOSAL | Status: DC | PRN
  Filled 2018-09-04: qty 177

## 2018-09-04 MED ORDER — MENTHOL 3 MG MT LOZG
1.0000 | LOZENGE | OROMUCOSAL | Status: DC | PRN
  Filled 2018-09-04: qty 9

## 2018-09-04 MED ORDER — DEXAMETHASONE SODIUM PHOSPHATE 10 MG/ML IJ SOLN
INTRAMUSCULAR | Status: DC | PRN
  Administered 2018-09-04: 10 mg via INTRAVENOUS

## 2018-09-04 MED ORDER — POLYETHYLENE GLYCOL 3350 17 G PO PACK: 17.0000 g | PACK | Freq: Every day | ORAL | Status: DC | PRN

## 2018-09-04 MED ORDER — OXYCODONE HCL 5 MG PO TABS
5.0000 mg | ORAL_TABLET | ORAL | Status: DC | PRN
  Administered 2018-09-05 – 2018-09-06 (×4): 5 mg via ORAL
  Filled 2018-09-04 (×3): qty 1

## 2018-09-04 MED ORDER — ACETAMINOPHEN 10 MG/ML IV SOLN
INTRAVENOUS | Status: AC
  Filled 2018-09-04: qty 100

## 2018-09-04 MED ORDER — GABAPENTIN 300 MG PO CAPS
300.0000 mg | ORAL_CAPSULE | Freq: Every day | ORAL | Status: DC
  Administered 2018-09-05: 16:00:00 300 mg via ORAL
  Filled 2018-09-04: qty 1

## 2018-09-04 MED ORDER — METHOCARBAMOL 1000 MG/10ML IJ SOLN
500.0000 mg | Freq: Four times a day (QID) | INTRAVENOUS | Status: DC
  Administered 2018-09-04: 500 mg via INTRAVENOUS
  Filled 2018-09-04 (×10): qty 5

## 2018-09-04 MED ORDER — SODIUM CHLORIDE 0.9% FLUSH
3.0000 mL | Freq: Two times a day (BID) | INTRAVENOUS | Status: DC
  Administered 2018-09-04 – 2018-09-05 (×2): 3 mL via INTRAVENOUS

## 2018-09-04 MED ORDER — ONDANSETRON HCL 4 MG/2ML IJ SOLN: 4.0000 mg | Freq: Once | INTRAMUSCULAR | Status: DC | PRN

## 2018-09-04 MED ORDER — PROPOFOL 500 MG/50ML IV EMUL
INTRAVENOUS | Status: DC | PRN
  Administered 2018-09-04: 60 ug/kg/min via INTRAVENOUS

## 2018-09-04 MED ORDER — ACETAMINOPHEN 10 MG/ML IV SOLN
INTRAVENOUS | Status: DC | PRN
  Administered 2018-09-04: 1000 mg via INTRAVENOUS

## 2018-09-04 MED ORDER — LIDOCAINE HCL (CARDIAC) PF 100 MG/5ML IV SOSY
PREFILLED_SYRINGE | INTRAVENOUS | Status: DC | PRN
  Administered 2018-09-04: 60 mg via INTRAVENOUS

## 2018-09-04 MED ORDER — ACETAMINOPHEN 325 MG PO TABS: 650.0000 mg | ORAL_TABLET | ORAL | Status: DC | PRN

## 2018-09-04 SURGICAL SUPPLY — 82 items
BUR NEURO DRILL SOFT 3.0X3.8M (BURR) ×3 IMPLANT
CAGE MODULUS XL 8X18X50 - 10 (Cage) ×3 IMPLANT
CANISTER SUCT 1200ML W/VALVE (MISCELLANEOUS) ×6 IMPLANT
CHLORAPREP W/TINT 26 (MISCELLANEOUS) ×12 IMPLANT
CORD BIP STRL DISP 12FT (MISCELLANEOUS) ×3 IMPLANT
COUNTER NEEDLE 20/40 LG (NEEDLE) ×3 IMPLANT
COVER LIGHT HANDLE STERIS (MISCELLANEOUS) ×12 IMPLANT
COVER WAND RF STERILE (DRAPES) ×3 IMPLANT
CRADLE LAMINECT ARM (MISCELLANEOUS) ×6 IMPLANT
CUP MEDICINE 2OZ PLAST GRAD ST (MISCELLANEOUS) ×3 IMPLANT
DERMABOND ADVANCED (GAUZE/BANDAGES/DRESSINGS) ×4
DERMABOND ADVANCED .7 DNX12 (GAUZE/BANDAGES/DRESSINGS) ×2 IMPLANT
DRAPE C-ARM 42X72 X-RAY (DRAPES) ×6 IMPLANT
DRAPE C-ARMOR (DRAPES) ×6 IMPLANT
DRAPE INCISE IOBAN 66X45 STRL (DRAPES) ×6 IMPLANT
DRAPE LAPAROTOMY 100X77 ABD (DRAPES) ×6 IMPLANT
DRAPE MICROSCOPE SPINE 48X150 (DRAPES) ×3 IMPLANT
DRAPE POUCH INSTRU U-SHP 10X18 (DRAPES) ×3 IMPLANT
DRAPE SURG 17X11 SM STRL (DRAPES) ×24 IMPLANT
DRAPE TABLE BACK 80X90 (DRAPES) ×3 IMPLANT
DRSG OPSITE POSTOP 4X6 (GAUZE/BANDAGES/DRESSINGS) IMPLANT
ELECT CAUTERY BLADE TIP 2.5 (TIP) ×6
ELECT EZSTD 165MM 6.5IN (MISCELLANEOUS) ×3
ELECT REM PT RETURN 9FT ADLT (ELECTROSURGICAL) ×6
ELECTRODE CAUTERY BLDE TIP 2.5 (TIP) ×2 IMPLANT
ELECTRODE EZSTD 165MM 6.5IN (MISCELLANEOUS) ×1 IMPLANT
ELECTRODE REM PT RTRN 9FT ADLT (ELECTROSURGICAL) ×2 IMPLANT
FEE INTRAOP MONITOR IMPULS NCS (MISCELLANEOUS) IMPLANT
FRAME EYE SHIELD (PROTECTIVE WEAR) ×6 IMPLANT
GLOVE BIOGEL PI IND STRL 7.0 (GLOVE) ×2 IMPLANT
GLOVE BIOGEL PI INDICATOR 7.0 (GLOVE) ×4
GLOVE SURG SYN 7.0 (GLOVE) ×12 IMPLANT
GLOVE SURG SYN 8.5  E (GLOVE) ×12
GLOVE SURG SYN 8.5 E (GLOVE) ×6 IMPLANT
GOWN SRG XL LVL 3 NONREINFORCE (GOWNS) ×2 IMPLANT
GOWN STRL NON-REIN TWL XL LVL3 (GOWNS) ×4
GOWN STRL REUS W/TWL MED LVL3 (GOWN DISPOSABLE) ×6 IMPLANT
GRADUATE 1200CC STRL 31836 (MISCELLANEOUS) ×3 IMPLANT
GUIDEWIRE NITINOL BEVEL TIP (WIRE) ×24 IMPLANT
INTRAOP MONITOR FEE IMPULS NCS (MISCELLANEOUS)
INTRAOP MONITOR FEE IMPULSE (MISCELLANEOUS)
KIT DILATOR XLIF 5 (KITS) ×2 IMPLANT
KIT NEEDLE NVM5 EMG ELECT (KITS) ×2 IMPLANT
KIT NEEDLE NVM5 EMG ELECTRODE (KITS) ×1
KIT SPINAL PRONEVIEW (KITS) ×3 IMPLANT
KIT SURGICAL ACCESS MAXCESS 4 (KITS) ×3 IMPLANT
KIT TURNOVER KIT A (KITS) ×3 IMPLANT
KIT XLIF (KITS) ×1
KNIFE BAYONET SHORT DISCETOMY (MISCELLANEOUS) IMPLANT
MARKER SKIN DUAL TIP RULER LAB (MISCELLANEOUS) ×9 IMPLANT
NDL SAFETY ECLIPSE 18X1.5 (NEEDLE) ×1 IMPLANT
NEEDLE HYPO 18GX1.5 SHARP (NEEDLE) ×2
NEEDLE HYPO 22GX1.5 SAFETY (NEEDLE) ×3 IMPLANT
NEEDLE I PASS (NEEDLE) ×3 IMPLANT
PACK LAMINECTOMY NEURO (CUSTOM PROCEDURE TRAY) ×3 IMPLANT
PAD ARMBOARD 7.5X6 YLW CONV (MISCELLANEOUS) ×3 IMPLANT
PENCIL ELECTRO HAND CTR (MISCELLANEOUS) ×3 IMPLANT
PLATE SINGLE HOLE MED 8 (Plate) ×3 IMPLANT
PUTTY DBM PROPEL MEDIUM (Putty) ×2 IMPLANT
PUTTY DBM PROPEL SM (Putty) ×3 IMPLANT
PUTTY PROPEL MEDIUM (Putty) ×1 IMPLANT
ROD RELINE MAS LORD 5.5X85MM (Rod) ×6 IMPLANT
SCREW LOCK RELINE 5.5 TULIP (Screw) ×24 IMPLANT
SCREW RELINE 8.5X50 (Screw) ×3 IMPLANT
SCREW RELINE MAS 7.5X35MM POLY (Screw) ×3 IMPLANT
SCREW RELINE MAS 7.5X50MM POLY (Screw) ×6 IMPLANT
SCREW RELINE MAS POLY 5.5X45MM (Screw) ×6 IMPLANT
SCREW RELINE MAS POLY 7.5X45MM (Screw) ×3 IMPLANT
SCREW RELINE MAS POLY 8.5X40MM (Screw) ×3 IMPLANT
SCREW XL 50X5.5XVA NS SPNE (Screw) ×1 IMPLANT
SCREW XL VAR (Screw) ×2 IMPLANT
SPOGE SURGIFLO 8M (HEMOSTASIS) ×2
SPONGE SURGIFLO 8M (HEMOSTASIS) ×1 IMPLANT
SUT DVC VLOC 3-0 CL 6 P-12 (SUTURE) ×3 IMPLANT
SUT VIC AB 0 CT1 27 (SUTURE) ×2
SUT VIC AB 0 CT1 27XCR 8 STRN (SUTURE) ×1 IMPLANT
SUT VIC AB 2-0 CT1 18 (SUTURE) ×3 IMPLANT
SYR 30ML LL (SYRINGE) ×6 IMPLANT
TOWEL OR 17X26 4PK STRL BLUE (TOWEL DISPOSABLE) ×9 IMPLANT
TRAY FOLEY MTR SLVR 16FR STAT (SET/KITS/TRAYS/PACK) IMPLANT
TUBING CONNECTING 10 (TUBING) ×6 IMPLANT
TUBING CONNECTING 10' (TUBING) ×3

## 2018-09-04 NOTE — OR Nursing (Signed)
Explanted 2 rods and 6 screws from back.

## 2018-09-04 NOTE — Transfer of Care (Signed)
Immediate Anesthesia Transfer of Care Note  Patient: Tina Hubbard  Procedure(s) Performed: L3-4 XLIF, L3-S1 PSF (MIS) (N/A Back)  Patient Location: PACU  Anesthesia Type:General  Level of Consciousness: drowsy and patient cooperative  Airway & Oxygen Therapy: Patient Spontanous Breathing and Patient connected to face mask oxygen  Post-op Assessment: Report given to RN and Post -op Vital signs reviewed and stable  Post vital signs: Reviewed and stable  Last Vitals:  Vitals Value Taken Time  BP 125/76 09/04/2018  5:18 PM  Temp 37.1 C 09/04/2018  5:18 PM  Pulse 110 09/04/2018  5:23 PM  Resp 13 09/04/2018  5:23 PM  SpO2 100 % 09/04/2018  5:23 PM  Vitals shown include unvalidated device data.  Last Pain:  Vitals:   09/04/18 1718  TempSrc:   PainSc: 10-Worst pain ever         Complications: No apparent anesthesia complications

## 2018-09-04 NOTE — Op Note (Addendum)
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  She failed conservative management and elected for surgical intervention.  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   Preoperative Note:   Risks of surgery discussed include: infection, bleeding, stroke, coma, death, paralysis, CSF leak, nerve/spinal cord injury, numbness, tingling, weakness, complex regional pain syndrome, recurrent stenosis and/or disc herniation, vascular injury, development of instability, neck/back pain, need for further surgery, persistent symptoms, development of deformity, and the risks of anesthesia. The patient understood these risks and agreed to proceed.  NAME OF ANTERIOR PROCEDURE:               1. Anterior lumbar interbody fusion via a right lateral retroperitoneal approach at L3/4 2. Placement of a Lordotic Modulus  10x18x50 interbody cage, filled with Demineralized Bone Matrix 3. Anterior instrumentation using Nuvasive Lateral Instrumentation  NAME OF POSTERIOR PROCEDURE: 1. Posterior instrumentation using   Nuvasive Reline Instrumentation 2. Posterolateral fusion, L3-S1      PROCEDURE:  Patient was brought to the operating room, intubated, turned to the prone position.  All pressure points were checked and double-checked. Prior to prepping, fluoroscopy was brought in and the patient was positioned.  The incision was marked upon the skin both the location of the disc space as well as the superior most aspect of the iliac crest.  Based on the identification of the disc space an incision was prepared, marked upon the skin and eventually was used for our lateral incision.  The  fluoroscopy was turned into a cross table A/P image in order to confirm that the patient's spine remained in a perpendicular trajectory to the floor without rotation.  Once confirming that all the pressure points were checked and double-checked and the patient remained in sturdy position strapped down, the patient was prepped and draped in standard fashion.  The skin was injected with local anesthetic, then incised until the abdominal wall fascia was noted.  I bluntly dissected posteriorly until we were able to identify the posterior musculature near petit's triangle.  At this point, using primarily blunt dissection with our finger aided with a metzenbaum scissor, were able to enter the retroperitoneal cavity.  The retroperitoneal potential space was opened further until palpating out the psoas muscle, the medial aspect of the iliac crest, the medial aspect of the last rib and continued to define the retroperitoneal space with blunt dissection in order to facilitate safe placement of our dilators.    While protecting by dissecting directly onto a finger in the retroperitoneum, the retroperitoneal space was entered safely from the lateral incision and the initial dilator placed onto the muscle belly of the psoas.  While directly stimulating the dilator and after radiographically confirming our location relative to the disc space, I placed the dilator through the psoas.  The dilators were stimulated to ensure remaining safely away from any of the lumbar plexus nerves; the dilators were repositioned until no pathologic stimulation was appreciated.  Once I had confirmed the location of our initial dilator radiographically, a K-wire secured the dilator into the L3/4 disc space and confirmed position under A/P and lateral fluoroscopy.  At this point, I dilated up with direct stimulation to confirm lack of pathologic stimulation.  Once all  the dilators were in position, I placed in the retractor and secured it onto  the table, locked into position and confirmed under A/P and lateral fluoroscopy to confirm our approach angle to the disc space as well as location relative to the disc space.  I then placed the muscle stimulator in through the working channel down to the vertebral body, stimulating the entire lateral surface of the vertebral body and any of the visualized psoas muscle that was adjacent to the retractor, confirming again the safe passage to the psoas before we began performing the discectomy.  At this point, we began our discectomy at L3/4.  The disc was incised laterally throughout the extent of our exposure. Using a combination of pituitary rongeurs, Kerrison rongeurs, rasps, curettes of various sorts, we were able to begin to clean out the disc space.  Once we had cleaned out the majority of the disc space, we then cut the lateral annulus with a cob, breaking the lateral annual attachments on the contralateral side by subtly working the cob through the annulus while using flouroscopy.  Care was taken not to extend further than required after cutting the annular attachments.  After this had been performed, we prepared the endplates for placement of our graft, sized a graft to the disc space by serially dilating up in trial sizes until we confirmed that our graft would be well positioned, allowing distraction while maintaining good grip.  This was confirmed under A/P and lateral fluoroscopy in order to ensure its placement as an eventual trial for placement of our final graft.  We irrigated with bacteriostatic saline.  Once confirmed placement, the Modulus implant filled with allograft was impacted into position at L3/4.   Through a combination of intradiscal distraction and anterior releasing, we were able to correct the anterior deformity during disc preparation and placement of the graft.   At this point we placed in lateral instrumentation.  Through the same working channel, a lateral modulus plate was  secured to the device. over the disc space. Using an awl, each of the screw positions was started and cannulated to a depth of 45 mm. A 5.5x45 mm screw was placed in position at L3. The placement of the plate was noted to be in appropriate position on lateral and AP flouroscopy.       At this point, we moved to the posterior portion. After placing the lateral cage, we brought in fluoroscopy to confirm our approach angles for putting in percutaneous pedicle screws.  The pedicles were marked using true AP flouroscopy, adjusting the angle at each level.  Wiltse incisions were marked. At this point, incisions were made for placing percutaneous pedicle screw instrumentation at L3-S1.  The incision were opened and carried through the fascia.  The prior instrumentation from L4 to S1 was identified and removed.  K wires were placed to secure the trajectories.    At L3, a Jamsheedi needle was used to cannulate the pedicle bilaterally using AP flouroscopy. Direct stimulation was used on the needle without any low (<15 mAmp) stimulation thresholds. After cannulation of the pedicle to 30 mm, a K-wire was placed through the [place] approximately and secured.   Due to small pedicle size, en fosse view was used to confirm K wire placement into L3 pedicles. The K wires were then checked using lateral flouroscopy to ensure placement into the vertebral bodies. After confirming placement of K wires, cannulated pedicle screws were introduced over the K wires at each level.  After  advancing each screw into the vertebral body approximately 25-30 mm, the K wire was removed.The L4-S1 screws were upsized and shortened on the left.  At L3, 5.5x45 mm Nuvasive Reline screws were placed.   Once the screws were placed, the screw extensions were then linked, a path was formed for the rod and a rod was utilized to connect the screws.  We then compressed, torqued / counter-torqued and removed the screw assembly. Once performed on each side,  confirmatory AP and lateral x-rays were taken and the case was completed.   Again we confirmed radiographically and began our closure.  The lateral wound was closed using 0 Vicryl interrupted suture in the fascia, 2-0 Vicryl inverted suture were placed in the subcutaneous tissue and dermis. 3-0 monocryl was used for final closure. Dermabond was used to close the lateral skin.    The wiltse incisions were closed in layers with 0, 2-0 vicryl and staples.    Needle, lap and all counts were correct at the end of the case.     Marin Olp PA assisted in the entire procedure.  Meade Maw MD Neurosurgery

## 2018-09-04 NOTE — Anesthesia Procedure Notes (Signed)
Procedure Name: Intubation Date/Time: 09/04/2018 1:11 PM Performed by: Jonna Clark, CRNA Pre-anesthesia Checklist: Patient identified, Patient being monitored, Timeout performed, Emergency Drugs available and Suction available Patient Re-evaluated:Patient Re-evaluated prior to induction Oxygen Delivery Method: Circle system utilized Preoxygenation: Pre-oxygenation with 100% oxygen Induction Type: IV induction Ventilation: Mask ventilation without difficulty Laryngoscope Size: Mac and 3 Grade View: Grade II Tube type: Oral Tube size: 7.0 mm Number of attempts: 1 Airway Equipment and Method: Stylet Placement Confirmation: ETT inserted through vocal cords under direct vision,  positive ETCO2 and breath sounds checked- equal and bilateral Secured at: 21 cm Tube secured with: Tape Dental Injury: Teeth and Oropharynx as per pre-operative assessment  Future Recommendations: Recommend- induction with short-acting agent, and alternative techniques readily available

## 2018-09-04 NOTE — Anesthesia Postprocedure Evaluation (Signed)
Anesthesia Post Note  Patient: Tina Hubbard  Procedure(s) Performed: L3-4 XLIF, L3-S1 PSF (MIS) (N/A Back)  Patient location during evaluation: PACU Anesthesia Type: General Level of consciousness: awake and alert Pain management: pain level controlled Vital Signs Assessment: post-procedure vital signs reviewed and stable Respiratory status: spontaneous breathing, nonlabored ventilation, respiratory function stable and patient connected to nasal cannula oxygen Cardiovascular status: blood pressure returned to baseline and stable Postop Assessment: no apparent nausea or vomiting Anesthetic complications: no     Last Vitals:  Vitals:   09/04/18 1910 09/04/18 2000  BP: (!) 109/54 (!) 97/50  Pulse: 100 95  Resp: 16 19  Temp: 36.4 C 36.6 C  SpO2: 99% 95%    Last Pain:  Vitals:   09/04/18 2000  TempSrc:   PainSc: Asleep                 Martha Clan

## 2018-09-04 NOTE — Progress Notes (Signed)
Procedure: L3-4 XLIF, L3-S1 PSF Procedure date: 09/04/2018 Diagnosis: Adjacent segment disease, lumbar radiculopathy  History: Tina Hubbard is s/p L3-4 XLIF, L3-S1 PSF  POD0: Tolerated procedure well.  Seen postoperatively still disoriented from anesthesia.  Complaining of significant back pain.  Physical Exam: Vitals:   09/04/18 1800 09/04/18 1803  BP:  (!) 108/52  Pulse: 99 95  Resp: 12 18  Temp:    SpO2: 100% 98%   Strength: Unable to accurately assess. Able to move limbs independently.  Sensation: Unable to accurately assess Skin: No active bleeding in incision sites  Data:  No results for input(s): NA, K, CL, CO2, BUN, CREATININE, LABGLOM, GLUCOSE, CALCIUM in the last 168 hours. No results for input(s): AST, ALT, ALKPHOS in the last 168 hours.  Invalid input(s): TBILI   No results for input(s): WBC, HGB, HCT, PLT in the last 168 hours. No results for input(s): APTT, INR in the last 168 hours.       Other tests/results: Lumbar x-ray pending  Assessment/Plan:  Tina Hubbard is POD 0 status post L3-L4 X LIF/L3-S1 PSF. We will continue to monitor for adequate pain control and symptom resolution.  - Brace - mobilize - pain control - DVT prophylaxis - PTOT  Marin Olp PA-C Department of Neurosurgery

## 2018-09-04 NOTE — H&P (Signed)
History of Present Illness: 09/04/2018 Tina Hubbard presents with continued symptoms.  She had her surgery delayed due to the coronavirus epidemic.  05/17/2018  Tina Hubbard comes back to see me. She continues to have the symptoms as described below. She has had a CT scan for evaluation of her fusion.  05/02/2018 Tina Hubbard is here today with a chief complaint of left lower back and hip pain, left lateral thigh pain, occasional numbness from knee to lower leg which causes her to feel like her leg is going to give out. She reports that the thigh is the most bothersome.  She originally had decompression surgery by Dr. Pamala Hurry in 2013 and ultimately had L4-S1 transforaminal lumbar interbody fusion by Dr. Laren Everts in 2016. She did very well from both the surgeries, but subsequently had onset of new symptoms over the last year to 2 years. She has pain in the left anterior thigh that ends around her knee consistent with an L4 radiculopathy. This is been very painful for her.   She has tried physical therapy for more than 3 months as well as multiple epidural steroid injections. She continues to have ongoing symptoms that have impaired her life. She feels like she has a knife stuck inside of her leg. This is bad as 9 out of 10 made worse by walking, standing, or laying on the left side. Sitting makes it better. She has no bowel or bladder dysfunction and no weakness. That being said, she does sometimes feel like her left leg is going to give out.  Conservative measures:  Physical therapy: has tried in April - June 2019, "helped a little but didn't last" Multimodal medical therapy including regular antiinflammatories: tramadol, gabapentin, cymbalta, celebrex, prednisone, norco Injections: has tried epidural steroid injections - were helping, but last injection didn't help at all 03/28/18: Left L3-4 TFESI by Dr Sharlet Salina 12/17/17: Left L3-4 TFESI by Dr Sharlet Salina 09/13/17: Left L3-4 TFESI by Dr Sharlet Salina 08/16/17:  Left L3-4 TFESI by Dr Sharlet Salina  Past Surgery: L4-S1 posterior spinal fusion in 2016 per Dr. Gloris Manchester and lumbar decompression in 2013 by Dr Blanche East.  Marni Griffon has no symptoms of cervical myelopathy.  The symptoms are causing a significant impact on the patient's life.   Review of Systems:  A 10 point review of systems is negative, except for the pertinent positives and negatives detailed in the HPI.  Past Medical History: Past Medical History:  Diagnosis Date  . Adenomatous polyps  . Awareness under anesthesia 1990s  Patient reports "a flash" of awareness under anesthesia during one of her TKA surgeries, but denies any pain.  . Borderline diabetes mellitus  . Depression  . Diabetes mellitus type 2, uncomplicated (CMS-HCC)  . Hyperlipidemia  . Hypertension  . Lumbar disc disease  . Osteoarthritis ([place]) 11/07/2013  a. Trigger nodule. b. Knees  . Personal history of other specified diseases(V13.89)  . Psoriatic arthritis (CMS-HCC) 2012  Methotrexate. Enbrel.   Past Surgical History: Past Surgical History:  Procedure Laterality Date  . ARTHRODESIS POSTERIOR LUMBAR SPINE W/LAMINECTOMY/DISCECTOMY Bilateral 11/20/2014  Procedure: L4/5 TLIF; Surgeon: Almon Register, MD; Location: [facility]; Service: Neurosurgery; Laterality: Bilateral;  . ARTHROPLASTY TOTAL KNEE Left 1990s  . ARTHROPLASTY TOTAL KNEE Right 1990s  . AUTOGRAFT OBTAINED SAME INCISION FOR SPINE SURGERY Bilateral 11/20/2014  Procedure: AUTOGRAFT OBTAINED SAME INCISION FOR SPINE SURGERY; Surgeon: Almon Register, MD; Location: [facility]; Service: Neurosurgery; Laterality: Bilateral;  . BACK SURGERY  . CESAREAN SECTION  . CHOLECYSTECTOMY ~  2012  . COLONOSCOPY 01/18/1998  Adenomatous Polyp  . COLONOSCOPY 01/10/2017  Adenomatous Polyps: [facility] 12/2021  . CORONARY ANGIOPLASTY  . ENDOSCOPIC CARPAL TUNNEL RELEASE Bilateral 1990s  . Extensive arthroscopic debridement wiht biceps  tenolysis,arthroscopic subacromail decompression and mini-open rotator cuff repair, right shoulder Right 09/19/2016  Dr.Poggi  . Eye surgeries x 2 to correct detached retina  . FLEXIBLE SIGMOIDOSCOPY 10/16/97  . INSERTION MORSELIZED BONE ALLOGRAFT FOR SPINE SURGERY Bilateral 11/20/2014  Procedure: INSERTION MORSELIZED BONE ALLOGRAFT FOR SPINE SURGERY; Surgeon: Almon Register, MD; Location: [facility]; Service: Neurosurgery; Laterality: Bilateral;  . INSTRUMENTATION POSTERIOR SPINE 1/2 VERTEBRAL SEGMENTS W/O FIXATION Bilateral 11/20/2014  Procedure: INSTRUMENTATION POSTERIOR SPINE 1/2 VERTEBRAL SEGMENTS W/O FIXATION; Surgeon: Almon Register, MD; Location: [facility]; Service: Neurosurgery; Laterality: Bilateral;  . JOINT REPLACEMENT  . KNEE ARTHROSCOPY  . LAMINECTOMY POSTERIOR LUMBAR FACETECTOMY & FORAMINOTOMY W/DECOMP N/A 12/27/2011  Procedure: LAMINECTOMY POSTERIOR LUMBAR FACETECTOMY & FORAMINOTOMY W/DECOMP Partial L4, Complete L5, partial S1 [id number], [id number] x2; Surgeon: Redge Gainer, MD; Location: [facility]; Service: Neurosurgery; Laterality: N/A;  . Open decompression of ulnar nerve at [facility], left wrist Left 02/21/2017  Dr.Poggi  . Open left carpal tunnel release and anterior subcutaneous transposition of ulnar nerve left elbow Left 02/27/2018  Dr.poggi  . PERCUTANEOUS BIOPSY BREAST Left 1973, 1979  . RELEASE OF TRIGGER FINGERS Bilateral 2000s  . TONSILLECTOMY [id number]  . VAGINAL HYSTERECTOMY 1989   Allergies: Allergies as of 05/16/2018 - Reviewed 05/16/2018  Allergen Reaction Noted  . Hydrocodone Other (See Comments) 04/18/2018  . Oxycodone Other (See Comments) 12/21/2014   Medications: Outpatient Encounter Medications as of 05/16/2018  Medication Sig Dispense Refill  . amLODIPine (NORVASC) 5 MG tablet TAKE ONE TABLET BY MOUTH EVERY DAY 90 tablet 1  . atorvastatin (LIPITOR) 20 MG tablet TAKE ONE TABLET BY MOUTH EVERY DAY 90 tablet 1  . busPIRone  (BUSPAR) 10 MG tablet TAKE ONE TABLET BY MOUTH TWICE DAILY 60 tablet 3  . celecoxib (CELEBREX) 200 MG capsule TAKE 1 CAPSULE BY MOUTH TWICE DAILY 180 capsule 1  . DULoxetine (CYMBALTA) 60 MG DR capsule TAKE 1 CAPSULE BY MOUTH EVERY DAY 90 capsule 1  . ENBREL SURECLICK 50 mg/mL (1 mL) pen injector INJECT 50MG  (ONE PEN) SUBCUTANEOUSLY (UNDER THE SKIN) ONCE A WEEK 4 Syringe 5  . estradiol (ESTRACE) 1 MG tablet Take 1 tablet (1 mg total) by mouth once daily 30 tablet 11  . gabapentin (NEURONTIN) 300 MG capsule 600mg  AM, 300mg  midday, 600mg  bedtime po 90 capsule 1  . omeprazole (PRILOSEC) 20 MG DR capsule TAKE 1 CAPSULE BY MOUTH DAILY 30 capsule 12  . traMADol (ULTRAM) 50 mg tablet Take 50 mg by mouth every 8 (eight) hours as needed for Pain  . traZODone (DESYREL) 50 MG tablet TAKE ONE TABLET BY MOUTH AT BEDTIME AS NEEDED FOR SLEEP 90 tablet 1  . valsartan-hydrochlorothiazide (DIOVAN-HCT) 320-12.5 mg tablet TAKE ONE TABLET BY MOUTH EVERY DAY 90 tablet 1  . VITAMIN B COMPLEX ORAL Take 1 tablet by mouth once daily   No facility-administered encounter medications on file as of 05/16/2018.   Social History: Social History   Tobacco Use  . Smoking status: Never Smoker  . Smokeless tobacco: Never Used  Substance Use Topics  . Alcohol use: No  . Drug use: No   Family Medical History: Family History  Problem Relation Age of Onset  . Diabetes type II Mother  . Uterine cancer Mother  . Osteoarthritis Mother  .  Cancer Son  Testicular cancer  . Coronary Artery Disease (Blocked arteries around heart) Father  . Diabetes type II Sister  . Coronary Artery Disease (Blocked arteries around heart) Sister  . Anesthesia problems Neg Hx  . Malignant hypertension Neg Hx  . Malignant hyperthermia Neg Hx  . Pseudochol deficiency Neg Hx   Physical Examination: Vitals:   Today's Vitals   09/04/18 1109  BP: (!) 145/76  Pulse: 94  Resp: 18  Temp: 98.8 F (37.1 C)  TempSrc: Tympanic  SpO2: 100%   Weight: 75.8 kg  Height: 5\' 4"  (1.626 m)  PainSc: 3    Body mass index is 28.67 kg/m.  Heart sounds normal no MRG. Chest Clear to Auscultation Bilaterally.   General: Patient is well developed, well nourished, calm, collected, and in no apparent distress. Attention to examination is appropriate.  Psychiatric: Patient is non-anxious.  Head: Pupils equal, round, and reactive to light.  ENT: Oral mucosa appears well hydrated.  Neck: Supple. Full range of motion.  Respiratory: Patient is breathing without any difficulty.  Extremities: No edema.  Vascular: Palpable dorsal pedal pulses.  Skin: On exposed skin, there are no abnormal skin lesions.  NEUROLOGICAL:   Awake, alert, oriented to person, place, and time. Speech is clear and fluent. Fund of knowledge is appropriate.   Cranial Nerves: Pupils equal round and reactive to light. Facial tone is symmetric. Facial sensation is symmetric. Shoulder shrug is symmetric. Tongue protrusion is midline. There is no pronator drift.  ROM of spine: diminished. Palpation of spine: mildly tender.   Strength: Side Biceps Triceps Deltoid Interossei Grip Wrist Ext. Wrist Flex.  R 5 5 5 5 5 5 5   L 5 5 5 5 5 5 5    Side Iliopsoas Quads Hamstring PF DF EHL  R 5 5 5 5 5 5   L 5 4+ 5 5 5 5    Reflexes are 1+ and symmetric at the biceps, triceps, brachioradialis, patella and achilles. Hoffman's is present on left. Clonus is not present. Toes are down-going.  Bilateral upper and lower extremity sensation is intact to light touch.  Gait is antalgic. No difficulty with tandem gait.  Rapid alternating movements are normal.   Medical Decision Making  Imaging: MRI L spine 04/30/2018  IMPRESSION: 1. Marked progression of L3-4 adjacent segment disease resulting in severe spinal stenosis and moderate right and severe left neural foraminal stenosis. 2. New L3 superior endplate fracture extending through the base of the left pedicle with minimal  vertebral body height loss and at most minimal edema suggesting a late subacute to chronic fracture. 3. New right-sided disc protrusion at L2-3 without significant stenosis.  Electronically Signed By: Logan Bores M.D. On: 04/30/2018 11:13  CT L spine 05/15/2018 IMPRESSION: 1. Severe spinal stenosis at L3-4 as noted on prior MRI. Advanced adjacent segment degeneration at L3-4 which is most severe on the left. Left L4 screw extends into the endplate and appears loose. 2. Small superior endplate fracture of L3 on the left of indeterminate age. No change from MRI. This is associated with a Schmorl's node. 3. Pedicle screw and interbody fusion L4-5 and L5-S1 without significant stenosis.  Electronically Signed By: Franchot Gallo M.D. On: 05/15/2018 13:57  I have personally reviewed the images and agree with the above interpretation.  Assessment and Plan: Tina Hubbard is a pleasant 73 y.o. female with adjacent segment disease causing spinal stenosis at L3-4. She has tried and failed conservative management. I have recommended surgical intervention, but there are several  possible approaches.  We discussed the various options. We ultimately decided on right-sided approach for L3-4 lateral lumbar interbody fusion followed by percutaneous L3 pedicle fixation and removal and replacement of her L4-S1 pedicle screws.  We will proceed with this plan today.  Meade Maw MD, [facility] Department of Neurosurgery

## 2018-09-04 NOTE — Anesthesia Preprocedure Evaluation (Signed)
Anesthesia Evaluation  Patient identified by MRN, date of birth, ID band Patient awake    Reviewed: Allergy & Precautions, NPO status , Patient's Chart, lab work & pertinent test results, reviewed documented beta blocker date and time   Airway Mallampati: III  TM Distance: >3 FB     Dental  (+) Chipped   Pulmonary           Cardiovascular hypertension, Pt. on medications + CAD       Neuro/Psych PSYCHIATRIC DISORDERS Anxiety Depression    GI/Hepatic GERD  Controlled,  Endo/Other    Renal/GU      Musculoskeletal  (+) Arthritis ,   Abdominal   Peds  Hematology   Anesthesia Other Findings Obese. 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 Last EKG ok.  Reproductive/Obstetrics                             Anesthesia Physical Anesthesia Plan  ASA: III  Anesthesia Plan: General   Post-op Pain Management:    Induction: Intravenous  PONV Risk Score and Plan:   Airway Management Planned: Oral ETT  Additional Equipment:   Intra-op Plan:   Post-operative Plan:   Informed Consent: I have reviewed the patients History and Physical, chart, labs and discussed the procedure including the risks, benefits and alternatives for the proposed anesthesia with the patient or authorized representative who has indicated his/her understanding and acceptance.       Plan Discussed with: CRNA  Anesthesia Plan Comments:         Anesthesia Quick Evaluation

## 2018-09-04 NOTE — Progress Notes (Signed)
Pharmacy consulted for surgical prophylaxis with cefazolin and vancomycin Cefazolin 2 g IV x1 and vancomycin 1250 mg IV x1 (based on weight of 75.8 kg) ordered

## 2018-09-04 NOTE — Anesthesia Post-op Follow-up Note (Signed)
Anesthesia QCDR form completed.        

## 2018-09-05 NOTE — TOC Initial Note (Signed)
Transition of Care [facility]) - Initial/Assessment Note    Patient Details  Name: Tina Hubbard MRN: [id number] Date of Birth: [birth year]  Transition of [facility]) CM/SW Contact:    Tina Hubbard, Tina Beets, LCSW Phone Number: 09/05/2018, 12:02 PM  Clinical Narrative: RN case manager spoke with Neuro PA who reported that patient has been set up with [facility] in their office prior to hospital stay. RN case manager confirmed with [facility] agency representative Tina Hubbard that they have accepted patient and will start services at home. Clinical Education officer, museum (CSW) and Chief Strategy Officer met with patient alone at bedside to discuss D/C plan. Patient was alert and oriented X4 and was sitting up in the chair at bedside. CSW introduced self and explained role of CSW department. Per patient she lives in [place] with her husband Tina Hubbard and plans on going home with [facility]. Per patient [facility] has already contacted her via telephone and she knows how to contact [facility] if needed. Patient is agreeable to [facility]. Per patient her husband works part time as a Education officer, community from 7 am to 12 pm however he is very close to home and can come home if needed. Per patient her husband will provide support. Patient reported that she already has a rolling walker, rollaider and cane at home and has no equipment needs. Patient reported no other needs or concerns.   Plan is for patient to D/C home with [facility]. CSW will continue to follow and assist as needed.   [employer], LCSW [phone]           Expected Discharge Plan: [place] Barriers to Discharge: Continued Medical Work up   Patient Goals and CMS Choice Patient states their goals for this hospitalization and ongoing recovery are:: For pain to get better. CMS Medicare.gov Compare Post Acute Care list provided to:: Other (Comment Required)(Surgeon's office has set up home health  with [facility] prior to hospital stay.) Choice offered to / list presented to : NA  Expected Discharge Plan and Services Expected Discharge Plan: [place] In-house Referral: Clinical Social Work Discharge Planning Services: CM Consult Post Acute Care Choice: NA Living arrangements for the past 2 months: Single Family Home                           HH Arranged: PT [place]: [facility] Date [place]: 09/05/18 Time [place]: 1159 Representative spoke with at [place] Arrangements/Services Living arrangements for the past 2 months: [place] with:: Spouse Patient language and need for interpreter reviewed:: No Do you feel safe going back to the place where you live?: Yes      Need for Family Participation in Patient Care: No (Comment) Care giver support system in place?: Yes (comment) Current home services: DME(Patient has a rolling walker, rollaider and cane at home.) Criminal Activity/Legal Involvement Pertinent to Current Situation/Hospitalization: No - Comment as needed  Activities of Daily Living Home Assistive Devices/Equipment: Blood pressure cuff, CBG Meter, Hearing aid, Walker (specify type), Cane (specify quad or straight), Eyeglasses ADL Screening (condition at time of admission) Patient's cognitive ability adequate to safely complete daily activities?: Yes Is the patient deaf or have difficulty hearing?: No Does the patient have difficulty seeing, even when wearing glasses/contacts?: No Does the patient have difficulty concentrating, remembering, or making decisions?: No  Patient able to express need for assistance with ADLs?: Yes Does the patient have difficulty dressing or bathing?: No Independently performs ADLs?: Yes (appropriate for developmental age) Does the patient have difficulty walking or climbing stairs?: No Weakness of Legs: None Weakness of Arms/Hands:  None  Permission Sought/Granted Permission sought to share information with : Other (comment)([facility]) Permission granted to share information with : Yes, Verbal Permission Granted              Emotional Assessment Appearance:: Appears stated age   Affect (typically observed): Accepting, Calm, Happy, Pleasant Orientation: : Oriented to Place, Oriented to Self, Oriented to  Time, Oriented to Situation Alcohol / Substance Use: Not Applicable Psych Involvement: No (comment)  Admission diagnosis:  M48.00 Patient Active Problem List   Diagnosis Date Noted  . S/P lumbar fusion 09/04/2018   PCP:  Dion Body, MD Pharmacy:   [place], [place] - [address] [address] Phone: [phone] Fax: [phone]     Social Determinants of Health (SDOH) Interventions    Readmission Risk Interventions No flowsheet data found.

## 2018-09-05 NOTE — Progress Notes (Signed)
Procedure: L3-4 XLIF, L3-S1 PSF Procedure date: 09/04/2018 Diagnosis: Adjacent segment disease, lumbar radiculopathy  History: Tina Hubbard is s/p L3-4 XLIF, L3-S1 PSF POD 1: Recovering well.  Denies any left lower extremity pain at this time, but symptoms were usually aggravated with walking and standing.  Complains of back pain 3/10 when laying still which increases to 8 or 9/10 when moving.  Also complains of right hip pain extending into the right thigh with numbness in the anterior lateral aspect of right thigh.  Voiding without issue with bedpan.  She has not yet ambulated.  Able to tolerate food without issue. No fever  POD0: Tolerated procedure well.  Seen postoperatively still disoriented from anesthesia.  Complaining of significant back pain.  Physical Exam: Vitals:   09/05/18 0343 09/05/18 0802  BP: 109/65 120/80  Pulse: 90 (!) 105  Resp: 18 18  Temp: 97.7 F (36.5 C) 97.8 F (36.6 C)  SpO2: 99% 99%   Strength: Unable to accurately assess. Able to move limbs independently.  Sensation: Unable to accurately assess Skin: Glue intact at lateral incision site.  Minimal amount of bleeding on lumbar dressing.   Data:  No results for input(s): NA, K, CL, CO2, BUN, CREATININE, LABGLOM, GLUCOSE, CALCIUM in the last 168 hours. No results for input(s): AST, ALT, ALKPHOS in the last 168 hours.  Invalid input(s): TBILI   No results for input(s): WBC, HGB, HCT, PLT in the last 168 hours. No results for input(s): APTT, INR in the last 168 hours.       Other tests/results: Lumbar x-ray pending  Assessment/Plan:  Tina Hubbard is POD 1 status post L3-L4 X LIF/L3-S1 PSF. We will continue to monitor for adequate pain control and symptom resolution.  - Brace - mobilize - pain control - DVT prophylaxis - PTOT  Marin Olp PA-C Department of Neurosurgery

## 2018-09-05 NOTE — Evaluation (Signed)
Occupational Therapy Evaluation Patient Details Name: Tina Hubbard MRN: [id number] DOB: 25-Jan-1946 Today's Date: 09/05/2018    History of Present Illness admitted for acute hopsitalization status post L3-4 lumbar fusion (09/04/18)   Clinical Impression   Pt seen for OT evaluation this date, POD#1 from lumbar fusion. 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 1 fall in past 12 months. However, has been having increased difficulty with all aspects of mobility and ADL due to back pain. Pt lives with spouse in a single family home with 2 steps to enter and no handrails with spouse able to provide 24/7 assist/support as needed for pt. Currently pt is at supervision level with all aspects of bed mobility using learned log roll technique, CGA for functional mobility and RW, and Min assist for LB ADL tasks. Pt educated in back precautions with handout provided, self care skills, bed mobility and functional transfer training, AE/DME for bathing, dressing, and toileting needs, and home/routines modifications and falls prevention strategies to maximize safety and functional independence while minimizing falls risk and maintaining precautions. Pt verbalized understanding of all education/training provided. Handout provided to support recall and carry over of learned precautions/techniques for bed mobility, functional transfers, and self care skills. Pt will benefit from skilled OT services while hospitalized to maximize return to PLOF. Do not anticipate need for skilled OT at home.    Follow Up Recommendations  No OT follow up    Equipment Recommendations  None recommended by OT    Recommendations for Other Services       Precautions / Restrictions Precautions Precautions: Fall;Back Restrictions Weight Bearing Restrictions: No      Mobility Bed Mobility Overal bed mobility: Needs Assistance Bed Mobility: Rolling;Sit to  Sidelying;Sidelying to Sit Rolling: Supervision Sidelying to sit: Supervision     Sit to sidelying: Supervision General bed mobility comments: instruction prior to attempts with good carryover  Transfers Overall transfer level: Needs assistance Equipment used: Rolling walker (2 wheeled) Transfers: Sit to/from Stand Sit to Stand: Min guard         General transfer comment: cues for hand and foot placement with improved transfer technique    Balance Overall balance assessment: Needs assistance Sitting-balance support: No upper extremity supported;Feet supported Sitting balance-Leahy Scale: Good     Standing balance support: No upper extremity supported;During functional activity Standing balance-Leahy Scale: Fair Standing balance comment: able to wash hands at sink without BUE support and no LOB                           ADL either performed or assessed with clinical judgement   ADL Overall ADL's : Needs assistance/impaired                                       General ADL Comments: Min A for LB ADL     Vision Patient Visual Report: No change from baseline       Perception     Praxis      Pertinent Vitals/Pain Pain Assessment: Faces Faces Pain Scale: Hurts a little bit Pain Location: back during bed mobility Pain Descriptors / Indicators: Aching Pain Intervention(s): Limited activity within patient's tolerance;Monitored during session;Repositioned     Hand Dominance Right   Extremity/Trunk Assessment Upper Extremity Assessment Upper Extremity Assessment: Overall WFL for  tasks assessed   Lower Extremity Assessment Lower Extremity Assessment: Defer to PT evaluation(R LE at least 4-/5 throughout, reported paresthesia R thigh; L LE at least 4/5 throughout, denies paresthesia)   Cervical / Trunk Assessment Cervical / Trunk Assessment: Normal   Communication Communication Communication: HOH   Cognition Arousal/Alertness:  Awake/alert Behavior During Therapy: WFL for tasks assessed/performed Overall Cognitive Status: Within Functional Limits for tasks assessed                                     General Comments  back brace on at start and end of session    Exercises Other Exercises Other Exercises: pt instructed in back precautions handout incorporating precautions and strategies for toileting, grooming at sink, bathing, dressing, positioning for sleep, and falls prevention   Shoulder Instructions      Home Living Family/patient expects to be discharged to:: Private residence Living Arrangements: Spouse/significant other Available Help at Discharge: Family;Available 24 hours/day Type of Home: House Home Access: Stairs to enter [employer] of Steps: 2 Entrance Stairs-Rails: Right;Left;Can reach both Home Layout: One level     Bathroom Shower/Tub: Tub/shower unit;Walk-in shower   Bathroom Toilet: Standard     Home Equipment: Environmental consultant - 2 wheels;Other (comment);Toilet riser;Shower seat;Bedside commode;Hand held shower head;Adaptive equipment(rollator) Adaptive Equipment: Reacher Additional Comments: plans to purchase a shower chair      Prior Functioning/Environment Level of Independence: Independent        Comments: Indep with ADLs, household and community mobilization without assist device; endorses single fall within previous six months; + driving, active volunteer at hospital (2 days/week)        OT Problem List: Pain;Impaired balance (sitting and/or standing);Decreased knowledge of use of DME or AE;Decreased knowledge of precautions;Decreased range of motion;Decreased strength      OT Treatment/Interventions: Self-care/ADL training;Therapeutic activities;Therapeutic exercise;Energy conservation;Patient/family education;DME and/or AE instruction;Balance training    OT Goals(Current goals can be found in the care plan section) Acute Rehab OT Goals Patient  Stated Goal: return to PLOF with less pain OT Goal Formulation: With patient Time For Goal Achievement: 09/19/18 Potential to Achieve Goals: Good ADL Goals Pt Will Perform Lower Body Dressing: with modified independence;with adaptive equipment;sit to/from stand(maintaining back precautions) Pt Will Transfer to Toilet: with supervision;ambulating(LRAD for amb, maintaining back precautions) Additional ADL Goal #1: Pt will independently instruct family in compression stocking mgt. Additional ADL Goal #2: Pt will independently instruct family in back brace mgt.  OT Frequency: Min 1X/week   Barriers to D/C:            Co-evaluation              AM-PAC OT "6 Clicks" Daily Activity     Outcome Measure Help from another person eating meals?: None Help from another person taking care of personal grooming?: None Help from another person toileting, which includes using toliet, bedpan, or urinal?: A Little Help from another person bathing (including washing, rinsing, drying)?: A Little Help from another person to put on and taking off regular upper body clothing?: None Help from another person to put on and taking off regular lower body clothing?: A Little 6 Click Score: 21   End of Session Equipment Utilized During Treatment: Gait belt;Rolling walker;Back brace  Activity Tolerance: Patient tolerated treatment well Patient left: in bed;with call bell/phone within reach;with bed alarm set;with SCD's reapplied;Other (comment)(back brace in place)  OT Visit Diagnosis: Other abnormalities  of gait and mobility (R26.89);Pain Pain - Right/Left: Right Pain - part of body: Leg(and back)                Time: 4784-1282 OT Time Calculation (min): 40 min Charges:  OT General Charges $OT Visit: 1 Visit OT Evaluation $OT Eval Low Complexity: 1 Low OT Treatments $Self Care/Home Management : 23-37 mins  Jeni Salles, MPH, MS, OTR/L ascom [phone] 09/05/18, 3:40 PM

## 2018-09-05 NOTE — Evaluation (Signed)
Physical Therapy Evaluation Patient Details Name: Tina Hubbard MRN: [id number] DOB: 17-Jul-1945 Today's Date: 09/05/2018   History of Present Illness  admitted for acute hopsitalization status post L3-4 lumbar fusion (09/04/18)  Clinical Impression  Upon evaluation, patient alert and oriented; follows commands. Eager for OOB attempts (expressing need for toileting).  Rates pain in R low back, R thigh 4-5/10; reports mild/mod paresthesia over R thigh. R hip movement generally guarded, but strength and ROM grossly functional for basic transfers and gait efforts.  Demonstrates ability to complete bed mobility with close sup; sit/stand, basic transfers and gait (5' x2) with RW, cga/min assist.  Fair/good LE strength and stability in upright position; fair/good comfort/confidence with movement abilities.  May consider trial without assist device next session. Additional distance/activity deferred at this time until TLSO arrives; will continue to assess/progress as appropriate. Would benefit from skilled PT to address above deficits and promote optimal return to PLOF; recommend transition to home with outpatient PT follow up as appropriate at discharge.    Follow Up Recommendations Outpatient PT    Equipment Recommendations       Recommendations for Other Services       Precautions / Restrictions Precautions Precautions: Fall;Back Required Braces or Orthoses: (TLSO ordered; cleared for OOB to chair/bathroom without brace) Restrictions Weight Bearing Restrictions: No      Mobility  Bed Mobility Overal bed mobility: Needs Assistance Bed Mobility: Supine to Sit     Supine to sit: Supervision     General bed mobility comments: educated in log rolling technique  Transfers Overall transfer level: Needs assistance Equipment used: Rolling walker (2 wheeled) Transfers: Sit to/from Stand Sit to Stand: Min guard            Ambulation/Gait Ambulation/Gait assistance: Min guard Gait  Distance (Feet): 5 Feet(x2) Assistive device: Rolling walker (2 wheeled)       General Gait Details: fair step height/length, good stability and overall comfort with mobility tasks; minimal use of RW. May plan to trial without next session.  Additional distance deferred until brace received/donned.  Stairs            Wheelchair Mobility    Modified Rankin (Stroke Patients Only)       Balance Overall balance assessment: Needs assistance Sitting-balance support: No upper extremity supported;Feet supported Sitting balance-Leahy Scale: Good       Standing balance-Leahy Scale: Fair                               Pertinent Vitals/Pain Pain Assessment: Faces Faces Pain Scale: Hurts little more Pain Location: R hip/thigh Pain Descriptors / Indicators: Grimacing;Guarding Pain Intervention(s): Limited activity within patient's tolerance;Monitored during session;Repositioned;Premedicated before session    Home Living Family/patient expects to be discharged to:: Private residence Living Arrangements: Spouse/significant other Available Help at Discharge: Family;Available 24 hours/day Type of Home: House Home Access: Stairs to enter Entrance Stairs-Rails: Right;Left;Can reach both Entrance Stairs-Number of Steps: 2 Home Layout: One level Home Equipment: None      Prior Function Level of Independence: Independent         Comments: Indep with ADLs, household and community mobilization without assist device; endorses single fall within previous six months; + driving, active volunteer at hospital (2 days/week)     Hand Dominance        Extremity/Trunk Assessment   Upper Extremity Assessment Upper Extremity Assessment: Overall WFL for tasks assessed    Lower Extremity Assessment  Lower Extremity Assessment: (R LE at least 4-/5 throughout, reported paresthesia R thigh; L LE at least 4/5 throughout, denies paresthesia)       Communication    Communication: HOH  Cognition Arousal/Alertness: Awake/alert Behavior During Therapy: WFL for tasks assessed/performed Overall Cognitive Status: Within Functional Limits for tasks assessed                                        General Comments      Exercises Other Exercises Other Exercises: Reviewed back precautions and functional implications; patient voiced undersatnding.  Will continue to monitor/reinforce as needed. Other Exercises: Toilet transfer, SPT to/from [facility] with RW, cga/close sup; hygiene, clothing management, close sup.   Assessment/Plan    PT Assessment Patient needs continued PT services  PT Problem List Decreased strength;Decreased range of motion;Decreased activity tolerance;Decreased balance;Decreased mobility;Decreased knowledge of use of DME;Decreased safety awareness;Decreased knowledge of precautions;Pain       PT Treatment Interventions DME instruction;Gait training;Stair training;Functional mobility training;Therapeutic activities;Patient/family education;Therapeutic exercise;Balance training    PT Goals (Current goals can be found in the Care Plan section)  Acute Rehab PT Goals Patient Stated Goal: to return home PT Goal Formulation: With patient Time For Goal Achievement: 09/19/18 Potential to Achieve Goals: Good    Frequency 7X/week   Barriers to discharge        Co-evaluation               AM-PAC PT "6 Clicks" Mobility  Outcome Measure Help needed turning from your back to your side while in a flat bed without using bedrails?: None Help needed moving from lying on your back to sitting on the side of a flat bed without using bedrails?: None Help needed moving to and from a bed to a chair (including a wheelchair)?: A Little Help needed standing up from a chair using your arms (e.g., wheelchair or bedside chair)?: A Little Help needed to walk in hospital room?: A Little Help needed climbing 3-5 steps with a railing? : A  Little 6 Click Score: 20    End of Session Equipment Utilized During Treatment: Gait belt Activity Tolerance: Patient tolerated treatment well Patient left: in chair;with chair alarm set;with call bell/phone within reach Nurse Communication: Mobility status PT Visit Diagnosis: Muscle weakness (generalized) (M62.81);Difficulty in walking, not elsewhere classified (R26.2);Pain Pain - Right/Left: Right Pain - part of body: Leg    Time: 2229-7989 PT Time Calculation (min) (ACUTE ONLY): 31 min   Charges:   PT Evaluation $PT Eval Moderate Complexity: 1 Mod PT Treatments $Therapeutic Activity: 8-22 mins        [place] Savino H. Owens Shark, PT, DPT, NCS 09/05/18, 11:08 AM [phone]

## 2018-09-05 NOTE — Progress Notes (Signed)
Called [employer] to request LSO brace

## 2018-09-05 NOTE — Plan of Care (Signed)

## 2018-09-06 ENCOUNTER — Inpatient Hospital Stay: Payer: Medicare Other

## 2018-09-06 MED ORDER — METHOCARBAMOL 750 MG PO TABS: 750.0000 mg | ORAL_TABLET | Freq: Four times a day (QID) | ORAL | 0 refills | Status: DC

## 2018-09-06 MED ORDER — OXYCODONE HCL 5 MG PO TABS: 5.0000 mg | ORAL_TABLET | ORAL | 0 refills | Status: DC | PRN

## 2018-09-06 NOTE — Care Management Important Message (Signed)
Important Message  Patient Details  Name: Tina Hubbard MRN: [id number] Date of Birth: 08-08-1945   Medicare Important Message Given:  Yes    Marshell Garfinkel, RN 09/06/2018, 10:49 AM

## 2018-09-06 NOTE — Progress Notes (Signed)
Physical Therapy Treatment Patient Details Name: Tina Hubbard MRN: [id number] DOB: 1945/11/26 Today's Date: 09/06/2018    History of Present Illness admitted for acute hopsitalization status post L3-4 lumbar fusion (09/04/18)    PT Comments    Patient with good progress towards all goals, easily completing full lap around nursing station and stairs, sup/mod indep.  Good stability and overall functional performance; intermittent cuing for adherence to back precautions.  Good LE strength; mild persistent paresthesia over R thigh. Demonstrates ability to indep don/doff TLSO, verbalizes wearing schedule; indep verbalizes back precautions; demonstrates understanding of HEP and progression as appropriate. Comfortable with upcoming discharge and current functional performance; no additional questions/concerns at this time.    Follow Up Recommendations  Outpatient PT     Equipment Recommendations       Recommendations for Other Services       Precautions / Restrictions Precautions Precautions: Fall Required Braces or Orthoses: Spinal Brace Spinal Brace: Thoracolumbosacral orthotic;Applied in sitting position(patient able to indep don, voices understanding of wearing schedule) Restrictions Weight Bearing Restrictions: No    Mobility  Bed Mobility Overal bed mobility: Modified Independent             General bed mobility comments: good integration of log rolling  Transfers Overall transfer level: Modified independent   Transfers: Sit to/from Stand           General transfer comment: good LE strength/stability, minimal/no use of bilat UE support  Ambulation/Gait Ambulation/Gait assistance: Modified independent (Device/Increase time) Gait Distance (Feet): 200 Feet     Gait velocity: 10' walk time, 9-10 seconds   General Gait Details: reciprocal stepping pattern with slow, but steady, gait performance; mild increase in sway to R with head turns/direction changes at  times, but self-corrects without LOB. Min cuing for turning body (vs trunk) when looking around to maintain adherence to back precautions.   Stairs Stairs: Yes Stairs assistance: Supervision;Modified independent (Device/Increase time) Stair Management: Two rails Number of Stairs: 4 General stair comments: step to pattern, leading with L Le; good stability/control   Wheelchair Mobility    Modified Rankin (Stroke Patients Only)       Balance Overall balance assessment: Needs assistance Sitting-balance support: No upper extremity supported;Feet supported Sitting balance-Leahy Scale: Good     Standing balance support: Bilateral upper extremity supported Standing balance-Leahy Scale: Fair                              Cognition   Behavior During Therapy: WFL for tasks assessed/performed Overall Cognitive Status: Within Functional Limits for tasks assessed                                        Exercises Other Exercises Other Exercises: Standing LE therex, 1x10, with bilat UEs on counter, cga/close sup: marching, hip flex/ext/abduct/adduct, mini squats, heel raises.  Cuing for abdominal bracing and core activation; min instruction for mechanics  Reviewed progression as appropriate.  Patient voiced understanding of all information provided    General Comments        Pertinent Vitals/Pain Pain Assessment: No/denies pain    Home Living                      Prior Function            PT Goals (current goals can now  be found in the care plan section) Acute Rehab PT Goals Patient Stated Goal: return to PLOF with less pain PT Goal Formulation: With patient Time For Goal Achievement: 09/19/18 Potential to Achieve Goals: Good Progress towards PT goals: Progressing toward goals    Frequency    7X/week      PT Plan Current plan remains appropriate    Co-evaluation              AM-PAC PT "6 Clicks" Mobility   Outcome  Measure  Help needed turning from your back to your side while in a flat bed without using bedrails?: None Help needed moving from lying on your back to sitting on the side of a flat bed without using bedrails?: None Help needed moving to and from a bed to a chair (including a wheelchair)?: None Help needed standing up from a chair using your arms (e.g., wheelchair or bedside chair)?: None Help needed to walk in hospital room?: None Help needed climbing 3-5 steps with a railing? : None 6 Click Score: 24    End of Session Equipment Utilized During Treatment: Gait belt Activity Tolerance: Patient tolerated treatment well Patient left: in bed;with call bell/phone within reach(patient declined bed alarm) Nurse Communication: Mobility status PT Visit Diagnosis: Muscle weakness (generalized) (M62.81);Difficulty in walking, not elsewhere classified (R26.2);Pain Pain - Right/Left: Right Pain - part of body: Leg     Time: 0935-1003 PT Time Calculation (min) (ACUTE ONLY): 28 min  Charges:  $Gait Training: 8-22 mins $Therapeutic Exercise: 8-22 mins                     Kalon Erhardt H. Owens Shark, PT, DPT, NCS 09/06/18, 10:47 AM [phone]

## 2018-09-06 NOTE — Discharge Summary (Signed)
Procedure: L3-4 XLIF, L3-S1 PSF Procedure date: 09/04/2018 Diagnosis: Adjacent segment disease, lumbar radiculopathy  History: ELINORE SHULTS is s/p L3-4 XLIF, L3-S1 PSF  POD2: Continues to do well. Was able to ambulated yesterday and left lower extremity symptoms have resolved. Continues to experience right hip/thigh pain/numbness since surgery. Pain has been adequately controlled with pain regimen, rated 5/10 on average primarily in back. Eating and voiding without issue. Denies any other complaints.  Received brace and feels this is helping with the pain control.   POD 1: Recovering well.  Denies any left lower extremity pain at this time, but symptoms were usually aggravated with walking and standing.  Complains of back pain 3/10 when laying still which increases to 8 or 9/10 when moving.  Also complains of right hip pain extending into the right thigh with numbness in the anterior lateral aspect of right thigh.  Voiding without issue with bedpan.  She has not yet ambulated.  Able to tolerate food without issue. No fever  POD0: Tolerated procedure well.  Seen postoperatively still disoriented from anesthesia.  Complaining of significant back pain.  Physical Exam: Vitals:   09/06/18 0007 09/06/18 0457  BP: 122/73 117/71  Pulse: 98 88  Resp: 20 18  Temp: 98.7 F (37.1 C) 98.5 F (36.9 C)  SpO2: 98% 99%   Strength: 5/5 throughout lower extremities  Sensation: intact and symmetric except left anterior thigh Skin: Glue intact at lateral incision site. Dressing moderate saturation.  Data:  No results for input(s): NA, K, CL, CO2, BUN, CREATININE, LABGLOM, GLUCOSE, CALCIUM in the last 168 hours. No results for input(s): AST, ALT, ALKPHOS in the last 168 hours.  Invalid input(s): TBILI   No results for input(s): WBC, HGB, HCT, PLT in the last 168 hours. No results for input(s): APTT, INR in the last 168 hours.       Other tests/results:  EXAM: LUMBAR SPINE - 2-3  VIEW  COMPARISON:  CT 05/15/2018, intraoperative plain film/fluoroscopy 09/04/2010  FINDINGS: Surgical changes of posterior lumbar interbody fusion with bilateral pedicle screw and rod fixation spanning L3-S1. Disc spacer/cage at L3-L4 appears centered, with interference screw from rightward approach.  Disc spacer at L4-L5 is centered.  Disc spacer at the L5-S1 level is off center towards the left, unchanged from the comparison image.  No hardware fracture identified.  No acute bony abnormality.  Alignment of the vertebral bodies maintained.  Surgical changes of cholecystectomy.  Aortic atherosclerosis.  Surgical staples along the posterior midline.  IMPRESSION: Surgical changes of posterior lumbar interbody fusion with bilateral pedicle screw and rod fixation spanning L3-S1, with interbody spacers at L3-L4, L4-L5, and Y6-T0 without complicating features. The spacer at L5-S1 is off center towards the left, unchanged from the intraoperative fluoroscopy.   Assessment/Plan:  Joseph Pierini is POD 2 status post L3-L4 X LIF/L3-S1 PSF. She is recovering well and symptoms that were present prior to surgery have resolved. Will continue post op pain control with pain medication, muscle relaxer, and celebrex. Home health PT has been set up with [facility]. She is scheduled to follow up in approximately 2 weeks to monitor progress. Advised to contact office if any questions or concerns arise before then.   Marin Olp PA-C Department of Neurosurgery

## 2018-09-06 NOTE — Progress Notes (Signed)
Initial completed on today's date- 09/06/18

## 2018-09-06 NOTE — TOC Transition Note (Signed)
Transition of Care [facility]) - CM/SW Discharge Note   Patient Details  Name: SHYVONNE CHASTANG MRN: [id number] Date of Birth: 1945/04/17  Transition of Care [facility]) CM/SW Contact:  Tola Meas, Lenice Llamas Phone Number: [phone]  09/06/2018, 1:27 PM   Clinical Narrative:   Patient will D/C home today with [facility] arranged by surgeon's office prior to hospital stay. Cassie [facility] representative is aware of D/C today and received orders from MD's office. Please reconsult if future social work needs arise. CSW signing off.     Final next level of care: [place] Barriers to Discharge: Barriers Resolved   Patient Goals and CMS Choice Patient states their goals for this hospitalization and ongoing recovery are:: To improve mobility CMS Medicare.gov Compare Post Acute Care list provided to:: Other (Comment Required)(Surgeon's office arrange home health through [facility] prior to hospital stay.) Choice offered to / list presented to : NA  Discharge Placement                       Discharge Plan and Services In-house Referral: Clinical Social Work Discharge Planning Services: CM Consult Post Acute Care Choice: NA                    HH Arranged: PT HH Agency: [facility] Date [place]: 09/06/18 Time HH Agency Contacted: 1200 Representative spoke with at [place]: Cassie  Social Determinants of Health ([facility]) Interventions     Readmission Risk Interventions No flowsheet data found.

## 2018-09-06 NOTE — Discharge Instructions (Signed)
Your surgeon has performed an operation on your lumbar spine (low back) to fuse two or more of the vertebrae (bones) together. This procedure is performed to treat a number of different spinal problems, including narrowing of the spinal canal (stenosis), herniated discs, degenerative changes, and injuries.   Many times, patients feel better immediately after surgery and can "overdo it." Even if you feel well, it is important that you follow these activity guidelines. If you do not let your back heal properly from the surgery, you can increase the chance of return of your symptoms and other complications. The following are instructions to help in your recovery once you have been discharged from the hospital.   * Do not take anti-inflammatory medications for 3 months after surgery (naproxen [Aleve], ibuprofen [Advil, Motrin], etc.). These medications can prevent your bones from healing properly.   Activity     No bending, lifting, or twisting ("BLT"). Avoid lifting objects heavier than 10 pounds (gallon milk jug).  Where possible, avoid household activities that involve lifting, bending, reaching, pushing, or pulling such as laundry, vacuuming, grocery shopping, and childcare. Try to arrange for help from friends and family for these activities while your back heals.   Increase physical activity slowly as tolerated.  Taking short walks is encouraged, but avoid strenuous exercise. Do not jog, run, bicycle, lift weights, or participate in any other exercises unless specifically allowed by your doctor. Avoid prolonged sitting, including car rides.   Talk to your doctor before resuming sexual activity.   You should not drive until cleared by your doctor.   Until released by your doctor, you should not return to work or school.  You should rest at home and let your body heal.   You may shower three days after your surgery.  After showering, lightly dab your incision dry. Do not take a tub bath or go  swimming until approved by your doctor at your follow-up appointment.   If your doctor ordered a lumbar brace for you, you should wear it whenever you are out of bed. You may remove it when lying down or sleeping. You should also wear it when riding in a car. Not all back surgeries require a lumbar brace.   If you smoke, we strongly recommend that you quit.  Smoking has been proven to interfere with normal bone healing and will dramatically reduce the success rate of your surgery. Please contact [facility] (800-QUIT-NOW) and use the resources at www.[facility].com for assistance in stopping smoking.   Surgical Incision   If you have a dressing on your incision, you may remove it two days after your surgery. Keep your incision area clean and dry.   If you have staples or stitches on your incision, you should have a follow up scheduled for removal. If you do not have staples or stitches, you will have steri-strips (small pieces of surgical tape) or Dermabond glue. The steri-strips/glue should begin to peel away within about a week (it is fine if the steri-strips fall off before then). If the strips are still in place one week after your surgery, you may gently remove them.   Diet            You may return to your usual diet. Be sure to stay hydrated.   When to Contact us   Although your surgery and recovery will likely be uneventful, you may have some residual numbness, aches, and pains in your back and/or legs. This is normal and should improve in  the next few weeks.   However, should you experience any of the following, contact us immediately:   - New numbness or weakness   - Pain that is progressively getting worse, and is not relieved by your pain medications or rest   - Bleeding, redness, swelling, pain, or drainage from surgical incision   - Chills or flu-like symptoms   - Fever greater than 101.0 F (38.3 C)   - Problems with bowel or bladder functions   - Difficulty breathing or  shortness of breath   - Warmth, tenderness, or swelling in your calf  Contact Information   - During office hours (Monday-Friday 9 am to 5 pm), please call your physician at [phone] [facility])   - After hours and weekends, please call the [place] Operator at [phone] and ask for the Neurosurgery Resident On Call   - For a life-threatening emergency, call 911

## 2018-09-09 ENCOUNTER — Encounter: Payer: Self-pay | Admitting: Neurosurgery

## 2018-09-10 ENCOUNTER — Encounter: Payer: Self-pay | Admitting: Neurosurgery

## 2018-10-04 ENCOUNTER — Ambulatory Visit
Admission: RE | Admit: 2018-10-04 | Discharge: 2018-10-04 | Disposition: A | Discharge status: Home / Self Care | Payer: Medicare Other | Source: Ambulatory Visit | Attending: Family Medicine | Admitting: Family Medicine

## 2018-10-04 ENCOUNTER — Other Ambulatory Visit: Payer: Self-pay

## 2018-10-04 DIAGNOSIS — Z1231 Encounter for screening mammogram for malignant neoplasm of breast: Secondary | ICD-10-CM | POA: Diagnosis not present

## 2018-10-21 ENCOUNTER — Emergency Department
Admission: EM | Admit: 2018-10-21 | Discharge: 2018-10-21 | Disposition: A | Discharge status: Home / Self Care | Payer: Medicare Other | Attending: Student in an Organized Health Care Education/Training Program | Admitting: Student in an Organized Health Care Education/Training Program

## 2018-10-21 ENCOUNTER — Other Ambulatory Visit: Payer: Self-pay

## 2018-10-21 DIAGNOSIS — I10 Essential (primary) hypertension: Secondary | ICD-10-CM | POA: Diagnosis not present

## 2018-10-21 DIAGNOSIS — Z96651 Presence of right artificial knee joint: Secondary | ICD-10-CM | POA: Insufficient documentation

## 2018-10-21 DIAGNOSIS — Z96652 Presence of left artificial knee joint: Secondary | ICD-10-CM | POA: Diagnosis not present

## 2018-10-21 DIAGNOSIS — Z79899 Other long term (current) drug therapy: Secondary | ICD-10-CM | POA: Insufficient documentation

## 2018-10-21 DIAGNOSIS — Z20828 Contact with and (suspected) exposure to other viral communicable diseases: Secondary | ICD-10-CM | POA: Insufficient documentation

## 2018-10-21 DIAGNOSIS — R7303 Prediabetes: Secondary | ICD-10-CM | POA: Insufficient documentation

## 2018-10-21 DIAGNOSIS — Z20822 Contact with and (suspected) exposure to covid-19: Secondary | ICD-10-CM

## 2018-10-21 NOTE — ED Provider Notes (Signed)
[facility] Emergency Department Provider Note  ____________________________________________  Time seen: Approximately 5:19 PM  I have reviewed the triage vital signs and the nursing notes.   HISTORY  Chief Complaint Covid testing   HPI Tina Hubbard is a 73 y.o. female who presents to the emergency department for COVID-19 testing.  Patient's son lives at home and has had exposure to COVID-19 from people at his job.  The son had COVID testing 2 days ago but has not received any results.  Patient is completely asymptomatic.  Past Medical History:  Diagnosis Date  . Adenoma of colon   . Anxiety   . Arthritis    Psoriatic  . Coronary artery disease   . DDD (degenerative disc disease), lumbar   . Depression   . Detached retina    has a sown in oil bubble in eye since Jan 17th  . GERD (gastroesophageal reflux disease)   . Hyperlipidemia   . Hypertension   . Left leg pain    occasional numbness, R/T "slipped disc" above fusion  . Osteoarthritis   . Pre-diabetes     Patient Active Problem List   Diagnosis Date Noted  . S/P lumbar fusion 09/04/2018    Past Surgical History:  Procedure Laterality Date  . ABDOMINAL HYSTERECTOMY    . ANGIOPLASTY    . BACK SURGERY  2015   SPINAL FUSION [facility]  . BILATERAL CARPAL TUNNEL RELEASE Bilateral   . BREAST BIOPSY Left yrs ago   EXCISIONAL X 2 - NEG  . CARPAL TUNNEL RELEASE Left 02/27/2018   Procedure: CARPAL TUNNEL RELEASE;  Surgeon: Corky Mull, MD;  Location: [facility];  Service: Orthopedics;  Laterality: Left;  . CHOLECYSTECTOMY    . COLONOSCOPY    . COLONOSCOPY WITH PROPOFOL N/A 01/10/2017   Procedure: COLONOSCOPY WITH PROPOFOL;  Surgeon: Manya Silvas, MD;  Location: [facility] ENDOSCOPY;  Service: Endoscopy;  Laterality: N/A;  . EYE SURGERY Bilateral    Cataract Extraction with IOL  . FLEXIBLE SIGMOIDOSCOPY    . JOINT REPLACEMENT Bilateral 2003   TOTAL KNEE REPLACEMENTS DR  CALIFF [facility]  . LUMBAR LAMINECTOMY    . POSTERIOR LUMBAR FUSION 4 LEVEL N/A 09/04/2018   Procedure: L3-4 XLIF, L3-S1 PSF (MIS);  Surgeon: Meade Maw, MD;  Location: [facility] ORS;  Service: Neurosurgery;  Laterality: N/A;  . RETINAL DETACHMENT SURGERY    . SHOULDER ARTHROSCOPY WITH OPEN ROTATOR CUFF REPAIR Right 09/19/2016   Procedure: SHOULDER ARTHROSCOPY WITH MINI OPEN ROTATOR CUFF REPAIR;  Surgeon: Corky Mull, MD;  Location: [facility] ORS;  Service: Orthopedics;  Laterality: Right;  Biceps tenolysis  . TENNIS ELBOW RELEASE/NIRSCHEL PROCEDURE Left 02/27/2018   Procedure: SUBCUTANEOUS ANTERIOR TRANSPOSITION OF TH EULNAR NERVE AT THE ELBOW;  Surgeon: Corky Mull, MD;  Location: [place];  Service: Orthopedics;  Laterality: Left;  . TRIGGER FINGER RELEASE    . ULNAR NERVE TRANSPOSITION Left 02/21/2017   Procedure: ULNAR NERVE DECOMPRESSION WRIST GUYON'S CANAL;  Surgeon: Corky Mull, MD;  Location: [place];  Service: Orthopedics;  Laterality: Left;    Prior to Admission medications   Medication Sig Start Date End Date Taking? Authorizing Provider  amLODipine (NORVASC) 5 MG tablet Take 5 mg by mouth daily.  05/22/16   [provider]  atorvastatin (LIPITOR) 20 MG tablet Take 20 mg by mouth daily.  03/28/16   [provider]  b complex vitamins tablet Take 1 tablet by mouth daily.  [provider]  busPIRone (BUSPAR) 10 MG tablet Take 10 mg by mouth daily.     [provider]  celecoxib (CELEBREX) 200 MG capsule Take 200 mg by mouth 2 (two) times daily.    [provider]  DULoxetine (CYMBALTA) 60 MG capsule Take 60 mg by mouth daily. 08/15/16   [provider]  estradiol (ESTRACE) 1 MG tablet Take 1 mg by mouth daily.    [provider]  gabapentin (NEURONTIN) 300 MG capsule Take 300-600 mg by mouth See admin instructions. Take 600 mg by mouth in the morning, 300 mg midday and 600 mg at bedtime 08/01/16 08/30/18   [provider]  ketorolac (ACULAR) 0.5 % ophthalmic solution Place 1 drop into the left eye 3 (three) times daily. 01/18/18   [provider]  methocarbamol (ROBAXIN) 750 MG tablet Take 1 tablet (750 mg total) by mouth 4 (four) times daily. 09/06/18   Marin Olp, PA-C  omeprazole (PRILOSEC) 20 MG capsule Take 20 mg by mouth daily.  04/19/16   [provider]  oxyCODONE (OXY IR/ROXICODONE) 5 MG immediate release tablet Take 1 tablet (5 mg total) by mouth every 3 (three) hours as needed for moderate pain ((score 4 to 6)). 09/06/18   Marin Olp, PA-C  traZODone (DESYREL) 50 MG tablet Take 50 mg by mouth at bedtime.  01/31/16 08/30/18  [provider]  valsartan-hydrochlorothiazide (DIOVAN-HCT) 320-12.5 MG tablet Take 1 tablet by mouth daily.  05/22/16   [provider]    Allergies Hydrocodone and Oxycodone  Family History  Problem Relation Age of Onset  . Uterine cancer Mother 53  . Diabetes Mother   . Diabetes Sister   . Breast cancer Neg Hx     Social History Social History   Tobacco Use  . Smoking status: Never Smoker  . Smokeless tobacco: Never Used  Substance Use Topics  . Alcohol use: Yes    Comment: glass of wine per month  . Drug use: No    Review of Systems Constitutional: Negative for fever. ENT: Negative for sore throat. Respiratory: Negative for cough Gastrointestinal: No abdominal pain.  No nausea, no vomiting.  No diarrhea.  Musculoskeletal: Negative for generalized body aches. Skin: Negative for rash/lesion/wound. Neurological: Negative for headaches, focal weakness or numbness.  ____________________________________________   PHYSICAL EXAM:  VITAL SIGNS: ED Triage Vitals  Enc Vitals Group     BP 10/21/18 1056 (!) 127/58     Pulse Rate 10/21/18 1054 84     Resp 10/21/18 1054 16     Temp 10/21/18 1054 98.2 F (36.8 C)     Temp Source 10/21/18 1054 Oral     SpO2 10/21/18 1054 97 %     Weight 10/21/18 1056  160 lb (72.6 kg)     Height 10/21/18 1056 5\' 3"  (1.6 m)     Head Circumference --      Peak Flow --      Pain Score 10/21/18 1056 0     Pain Loc --      Pain Edu? --      Excl. in [place]? --     Constitutional: Alert and oriented. Well appearing and in no acute distress. Eyes: Conjunctivae are normal. PERRL. EOMI. Head: Atraumatic. Nose: No congestion/rhinnorhea. Mouth/Throat: Mucous membranes are moist. Neck: No stridor.  Cardiovascular: Normal rate, regular rhythm. Good peripheral circulation. Respiratory: Normal respiratory effort. Musculoskeletal: Full ROM throughout.  Neurologic:  Normal speech and language. No gross focal neurologic deficits are  appreciated. Speech is normal. No gait instability. Skin:  Skin is warm, dry and intact. No rash noted. Psychiatric: Mood and affect are normal. Speech and behavior are normal.  ____________________________________________   LABS (all labs ordered are listed, but only abnormal results are displayed)  Labs Reviewed  NOVEL CORONAVIRUS, NAA (HOSPITAL ORDER, SEND-OUT TO REF LAB)   ____________________________________________  EKG  Not indicated ____________________________________________  RADIOLOGY  None ____________________________________________   PROCEDURES  Not indicated ____________________________________________   INITIAL IMPRESSION / ASSESSMENT AND PLAN / ED COURSE     Pertinent labs & imaging results that were available during my care of the patient were reviewed by me and considered in my medical decision making (see chart for details).  73 year old female presenting to the emergency department for COVID-19 testing.  She is currently asymptomatic.  Exposure is not confirmed, however son has been exposed.  She was advised to stay home until her COVID screening is back.  She was provided information on quarantine protocol.  She was advised return to the emergency department for symptoms change or worsen if  unable to schedule appointment with primary care provider.  Tina Hubbard was evaluated in Emergency Department on 10/21/2018 for the symptoms described in the history of present illness. She was evaluated in the context of the global COVID-19 pandemic, which necessitated consideration that the patient might be at risk for infection with the SARS-CoV-2 virus that causes COVID-19. Institutional protocols and algorithms that pertain to the evaluation of patients at risk for COVID-19 are in a state of rapid change based on information released by regulatory bodies including the CDC and federal and state organizations. These policies and algorithms were followed during the patient's care in the ED.  ____________________________________________   FINAL CLINICAL IMPRESSION(S) / ED DIAGNOSES  Final diagnoses:  Exposure to Covid-19 Virus       Victorino Dike, FNP 10/21/18 1721    Merlyn Lot, MD 10/22/18 [phone]

## 2018-10-21 NOTE — ED Triage Notes (Signed)
Pt states here sons coworker was positive for covid and her son went on Saturday and was tested but no results yet. Pt denies having any sx at present.

## 2018-10-21 NOTE — ED Notes (Signed)
See triage note  States that her son was exposed to [place]   And he lives with them  Has had slight cough  No fever

## 2018-10-22 LAB — NOVEL CORONAVIRUS, NAA (HOSP ORDER, SEND-OUT TO REF LAB; TAT 18-24 HRS): SARS-CoV-2, NAA: NOT DETECTED

## 2019-06-24 ENCOUNTER — Encounter: Payer: Self-pay | Admitting: Ophthalmology

## 2019-07-01 ENCOUNTER — Telehealth (INDEPENDENT_AMBULATORY_CARE_PROVIDER_SITE_OTHER): Payer: Self-pay

## 2019-07-01 NOTE — Telephone Encounter (Signed)
Patient called stating she was seen by Dr. Talbert Forest.  Dr. Talbert Forest told patient he could try to do the a laser on her right eye to see if he could get any improvement in vision.  He told her he would not want to try this until Dr. Zadie Rhine was aware of this first per pt.

## 2019-07-08 ENCOUNTER — Encounter (INDEPENDENT_AMBULATORY_CARE_PROVIDER_SITE_OTHER): Payer: Medicare Other | Admitting: Ophthalmology

## 2019-07-16 ENCOUNTER — Other Ambulatory Visit: Payer: Self-pay

## 2019-07-16 ENCOUNTER — Ambulatory Visit (INDEPENDENT_AMBULATORY_CARE_PROVIDER_SITE_OTHER): Payer: Medicare Other | Admitting: Ophthalmology

## 2019-07-16 ENCOUNTER — Encounter (INDEPENDENT_AMBULATORY_CARE_PROVIDER_SITE_OTHER): Payer: Self-pay | Admitting: Ophthalmology

## 2019-07-16 DIAGNOSIS — H353112 Nonexudative age-related macular degeneration, right eye, intermediate dry stage: Secondary | ICD-10-CM | POA: Insufficient documentation

## 2019-07-16 DIAGNOSIS — H26491 Other secondary cataract, right eye: Secondary | ICD-10-CM

## 2019-07-16 DIAGNOSIS — H35351 Cystoid macular degeneration, right eye: Secondary | ICD-10-CM

## 2019-07-16 DIAGNOSIS — H35371 Puckering of macula, right eye: Secondary | ICD-10-CM

## 2019-07-16 DIAGNOSIS — H3341 Traction detachment of retina, right eye: Secondary | ICD-10-CM | POA: Diagnosis not present

## 2019-07-16 DIAGNOSIS — H353211 Exudative age-related macular degeneration, right eye, with active choroidal neovascularization: Secondary | ICD-10-CM | POA: Insufficient documentation

## 2019-07-16 DIAGNOSIS — H43811 Vitreous degeneration, right eye: Secondary | ICD-10-CM | POA: Insufficient documentation

## 2019-07-16 HISTORY — DX: Cystoid macular degeneration, right eye: H35.351

## 2019-07-16 HISTORY — DX: Other secondary cataract, right eye: H26.491

## 2019-07-16 NOTE — Progress Notes (Signed)
07/16/2019     CHIEF COMPLAINT Patient presents for Retina Follow Up   HISTORY OF PRESENT ILLNESS: Tina Hubbard is a 74 y.o. 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  Severity is moderate.  Since onset it is stable.  I, the attending physician,  performed the HPI with the patient and updated documentation appropriately.          Comments    5 Week f\u OD. OCT  Pt sates she cannot see much out of OD. Pt states Dr. Talbert Forest can do a YAG procedure on OD but needs clearance from Dr. Zadie Rhine.        Last edited by Tilda Franco on 07/16/2019  2:53 PM. (History)      Referring physician: Dion Body, MD [facility] [place],  [place] [address]  HISTORICAL INFORMATION:   Selected notes from the MEDICAL RECORD NUMBER       CURRENT MEDICATIONS: Current Outpatient Medications (Ophthalmic Drugs)  Medication Sig  . ketorolac (ACULAR) 0.5 % ophthalmic solution Place 1 drop into the left eye 3 (three) times daily.   No current facility-administered medications for this visit. (Ophthalmic Drugs)   Current Outpatient Medications (Other)  Medication Sig  . etanercept (ENBREL SURECLICK) 50 MG/ML injection Inject into the skin.  Marland Kitchen tiZANidine (ZANAFLEX) 2 MG tablet Take by mouth.  Marland Kitchen amLODipine (NORVASC) 5 MG tablet Take 5 mg by mouth daily.   Marland Kitchen atorvastatin (LIPITOR) 20 MG tablet Take 20 mg by mouth daily.   Marland Kitchen b complex vitamins tablet Take 1 tablet by mouth daily.  . busPIRone (BUSPAR) 10 MG tablet Take 10 mg by mouth daily.   . busPIRone (BUSPAR) 10 MG tablet SMARTSIG:1 Tablet(s) By Mouth Twice Daily  . celecoxib (CELEBREX) 200 MG capsule Take 200 mg by mouth 2 (two) times daily.  . DULoxetine (CYMBALTA) 60 MG capsule Take 60 mg by mouth daily.  Marland Kitchen estradiol (ESTRACE) 1 MG tablet Take 1 mg by mouth daily.  Marland Kitchen gabapentin (NEURONTIN) 300 MG capsule Take 300-600 mg by  mouth See admin instructions. Take 600 mg by mouth in the morning, 300 mg midday and 600 mg at bedtime  . methocarbamol (ROBAXIN) 750 MG tablet Take 1 tablet (750 mg total) by mouth 4 (four) times daily.  Marland Kitchen omeprazole (PRILOSEC) 20 MG capsule Take 20 mg by mouth daily.   Marland Kitchen oxyCODONE (OXY IR/ROXICODONE) 5 MG immediate release tablet Take 1 tablet (5 mg total) by mouth every 3 (three) hours as needed for moderate pain ((score 4 to 6)).  Marland Kitchen traZODone (DESYREL) 50 MG tablet Take 50 mg by mouth at bedtime.   . valsartan-hydrochlorothiazide (DIOVAN-HCT) 320-12.5 MG tablet Take 1 tablet by mouth daily.    No current facility-administered medications for this visit. (Other)      REVIEW OF SYSTEMS:    ALLERGIES Allergies  Allergen Reactions  . Hydrocodone Nausea And Vomiting  . Oxycodone Other (See Comments)    Caused jitteriness and crying spells    PAST MEDICAL HISTORY Past Medical History:  Diagnosis Date  . Adenoma of colon   . Anxiety   . Arthritis    Psoriatic  . Coronary artery disease   . DDD (degenerative disc disease), lumbar   . Depression   . Detached retina    has a sown in oil bubble in eye  since Jan 17th  . GERD (gastroesophageal reflux disease)   . Hyperlipidemia   . Hypertension   . Left leg pain    occasional numbness, R/T "slipped disc" above fusion  . Osteoarthritis   . Pre-diabetes    Past Surgical History:  Procedure Laterality Date  . ABDOMINAL HYSTERECTOMY    . ANGIOPLASTY    . BACK SURGERY  2015   SPINAL FUSION [facility]  . BILATERAL CARPAL TUNNEL RELEASE Bilateral   . BREAST BIOPSY Left yrs ago   EXCISIONAL X 2 - NEG  . CARPAL TUNNEL RELEASE Left 02/27/2018   Procedure: CARPAL TUNNEL RELEASE;  Surgeon: Corky Mull, MD;  Location: [place];  Service: Orthopedics;  Laterality: Left;  . CATARACT EXTRACTION Bilateral    Dr. Talbert Forest  . CHOLECYSTECTOMY    . COLONOSCOPY    . COLONOSCOPY WITH PROPOFOL N/A 01/10/2017   Procedure:  COLONOSCOPY WITH PROPOFOL;  Surgeon: Manya Silvas, MD;  [facility] END[facility]COPY;  Service: Endoscopy;  Laterality: N/A;  . EYE SURGERY Bilateral    Cataract Extraction with IOL  . FLEXIBLE SIGMOIDOSCOPY    . JOINT REPLACEMENT Bilateral 2003   TOTAL KNEE REPLACEMENTS DR CALIFF [facility]  . LUMBAR LAMINECTOMY    . POSTERIOR LUMBAR FUSION 4 LEVEL N/A 09/04/2018   Procedure: L3-4 XLIF, L3-S1 PSF (MIS);  Surgeon: Meade Maw, MD;  Location: [facility] ORS;  Service: Neurosurgery;  Laterality: N/A;  . RETINAL DETACHMENT SURGERY    . SHOULDER ARTHROSCOPY WITH OPEN ROTATOR CUFF REPAIR Right 09/19/2016   Procedure: SHOULDER ARTHROSCOPY WITH MINI OPEN ROTATOR CUFF REPAIR;  Surgeon: Corky Mull, MD;  Location: [facility] ORS;  Service: Orthopedics;  Laterality: Right;  Biceps tenolysis  . TENNIS ELBOW RELEASE/NIRSCHEL PROCEDURE Left 02/27/2018   Procedure: SUBCUTANEOUS ANTERIOR TRANSPOSITION OF TH EULNAR NERVE AT THE ELBOW;  Surgeon: Corky Mull, MD;  Location: [place];  Service: Orthopedics;  Laterality: Left;  . TRIGGER FINGER RELEASE    . ULNAR NERVE TRANSPOSITION Left 02/21/2017   Procedure: ULNAR NERVE DECOMPRESSION WRIST GUYON'S CANAL;  Surgeon: Corky Mull, MD;  Location: [place];  Service: Orthopedics;  Laterality: Left;    FAMILY HISTORY Family History  Problem Relation Age of Onset  . Uterine cancer Mother 20  . Diabetes Mother   . Diabetes Sister   . Breast cancer Neg Hx     SOCIAL HISTORY Social History   Tobacco Use  . Smoking status: Never Smoker  . Smokeless tobacco: Never Used  Substance Use Topics  . Alcohol use: Yes    Comment: glass of wine per month  . Drug use: No         OPHTHALMIC EXAM:  Base Eye Exam    Visual Acuity (Snellen - Linear)      Right Left   Dist cc 20/400 20/25   Dist ph cc NI        Tonometry (Tonopen, 2:40 PM)      Right Left   Pressure 12 17       Pupils      Pupils Dark Light Shape React APD   Right PERRL  4 4 Irregular Minimal None   Left PERRL 4 3 Round Brisk None       Visual Fields (Counting fingers)      Left Right    Full    Restrictions  Total superior temporal, superior nasal, inferior nasal deficiencies       Neuro/Psych    Oriented  x3: Yes   Mood/Affect: Normal       Dilation    Right eye: 1.0% Mydriacyl, 2.5% Phenylephrine @ 2:40 PM        Slit Lamp and Fundus Exam    External Exam      Right Left   External Normal Normal       Slit Lamp Exam      Right Left   Lids/Lashes Normal    Conjunctiva/Sclera White and quiet    Cornea Clear    Anterior Chamber Deep and quiet    Iris Round and reactive    Lens Posterior chamber intraocular lens, 3+ Posterior capsular opacification Posterior chamber intraocular lens       Fundus Exam      Right Left   Posterior Vitreous Vitrectomized    Disc Normal    C/D Ratio 0.4    Macula Epiretinal membrane with moderate to severe topographic distortion, Epiretinal membrane    Vessels Normal    Periphery No retinal scarring, retinal detached peripherally in the macula is a attached.           IMAGING AND PROCEDURES  Imaging and Procedures for @TODAY @  OCT, Retina - OU - Both Eyes       Right Eye Quality was good. Scan locations included subfoveal. Central Foveal Thickness: 320. Progression has been stable. Findings include abnormal foveal contour, epiretinal membrane.   Left Eye Progression has been stable. Findings include normal observations.   Notes OD with media opacity, stable epiretinal membrane with topographic distortion as compared to March 2021, still worse than October 2020                ASSESSMENT/PLAN:  Right epiretinal membrane The nature of macular pucker (epiretinal membrane ERM) was discussed with the patient as well as threshold criteria for vitrectomy surgery. I explained that in rare cases another surgery is needed to actually remove a second wrinkle should it regrow.  Most often, the  epiretinal membrane and underlying wrinkled internal limiting membrane are removed with the first surgery, to accomplish the goals.   If the operative eye is [place] (natural lens still present), cataract surgery is often recommended prior to Vitrectomy. This will enable the retina surgeon to have the best view during surgery and the patient to obtain optimal results in the future. Treatment options were discussed.  Right posterior capsular opacification Posterior capsule opacification is present but not visually significant. I explained to the patient that it is like ice or dirt building up on the windshield of your car clouding your vision. You do not have to stop and clean it until it hinders your vision. I explained, that when needed , a procedure called laser YAG  capsulotomy can be done to improve vision.  OD with cloudy media most likely from the cloudy capsule seen on examination.  I will suggest patient return to Dr. Talbert Forest for consideration of YAG capsulotomy right eye. If there is a delay in the this process or scheduling we would be delighted to help her here without in the right eye as well.      ICD-10-CM   1. Right posterior capsular opacification  H26.491   2. Right epiretinal membrane  H35.371 OCT, Retina - OU - Both Eyes  3. Cystoid macular edema of right eye  H35.351 OCT, Retina - OU - Both Eyes  4. Traction detachment of right retina  H33.41 OCT, Retina - OU - Both Eyes    1.  OD,  medial opacity from the capsule opacification.  Patient requires YAG capsulotomy.  Patient will contact Dr. Payton Emerald office to to schedule.  2.  The with epiretinal membrane as a post retinal detachment consequence.  I do believe that this is best to watch this and determine if or when visual acuity decline might benefit from vitrectomy with membrane peel.  Both the patient and I believe that right now best to observe.  3.  The retina remains nicely attached.  Ophthalmic Meds Ordered this visit:  No  orders of the defined types were placed in this encounter.      Return in about 3 months (around 10/15/2019) for OCT, dilate, OD.  There are no Patient Instructions on file for this visit.   Explained the diagnoses, plan, and follow up with the patient and they expressed understanding.  Patient expressed understanding of the importance of proper follow up care.   Clent Demark Brentley Landfair M.D. Diseases & Surgery of the Retina and Vitreous Retina & Diabetic [place] @TODAY @     Abbreviations: M myopia (nearsighted); A astigmatism; H hyperopia (farsighted); P presbyopia; Mrx spectacle prescription;  CTL contact lenses; OD right eye; OS left eye; OU both eyes  XT exotropia; ET esotropia; PEK punctate epithelial keratitis; PEE punctate epithelial erosions; DES dry eye syndrome; MGD meibomian gland dysfunction; ATs artificial tears; PFAT's preservative free artificial tears; [place] nuclear sclerotic cataract; PSC posterior subcapsular cataract; ERM epi-retinal membrane; PVD posterior vitreous detachment; RD retinal detachment; DM diabetes mellitus; DR diabetic retinopathy; NPDR non-proliferative diabetic retinopathy; PDR proliferative diabetic retinopathy; CSME clinically significant macular edema; DME diabetic macular edema; dbh dot blot hemorrhages; CWS cotton wool spot; POAG primary open angle glaucoma; C/D cup-to-disc ratio; HVF humphrey visual field; GVF goldmann visual field; OCT optical coherence tomography; IOP intraocular pressure; BRVO Branch retinal vein occlusion; CRVO central retinal vein occlusion; CRAO central retinal artery occlusion; BRAO branch retinal artery occlusion; RT retinal tear; SB scleral buckle; PPV pars plana vitrectomy; VH Vitreous hemorrhage; PRP panretinal laser photocoagulation; IVK intravitreal kenalog; VMT vitreomacular traction; MH Macular hole;  NVD neovascularization of the disc; NVE neovascularization elsewhere; AREDS age related eye disease study; ARMD age related macular  degeneration; POAG primary open angle glaucoma; EBMD epithelial/anterior basement membrane dystrophy; ACIOL anterior chamber intraocular lens; IOL intraocular lens; PCIOL posterior chamber intraocular lens; Phaco/IOL phacoemulsification with intraocular lens placement; [place] photorefractive keratectomy; LASIK laser assisted in situ keratomileusis; HTN hypertension; DM diabetes mellitus; COPD chronic obstructive pulmonary disease

## 2019-07-16 NOTE — Assessment & Plan Note (Signed)

## 2019-07-16 NOTE — Assessment & Plan Note (Signed)
Posterior capsule opacification is present but not visually significant. I explained to the patient that it is like ice or dirt building up on the windshield of your car clouding your vision. You do not have to stop and clean it until it hinders your vision. I explained, that when needed , a procedure called laser YAG  capsulotomy can be done to improve vision.  OD with cloudy media most likely from the cloudy capsule seen on examination.  I will suggest patient return to Dr. Talbert Forest for consideration of YAG capsulotomy right eye. If there is a delay in the this process or scheduling we would be delighted to help her here without in the right eye as well.

## 2019-07-24 ENCOUNTER — Encounter: Payer: Self-pay | Admitting: Ophthalmology

## 2019-09-11 ENCOUNTER — Other Ambulatory Visit: Payer: Self-pay | Admitting: Family Medicine

## 2019-09-25 IMAGING — MG DIGITAL SCREENING BILATERAL MAMMOGRAM WITH TOMO AND CAD
6 of 10 series · 6 of 30 positions shown · non-contrast
Comparison: Previous exam(s).

CLINICAL DATA: Screening.

EXAM:
DIGITAL SCREENING BILATERAL MAMMOGRAM WITH TOMO AND CAD

[R MLO synth-2D]
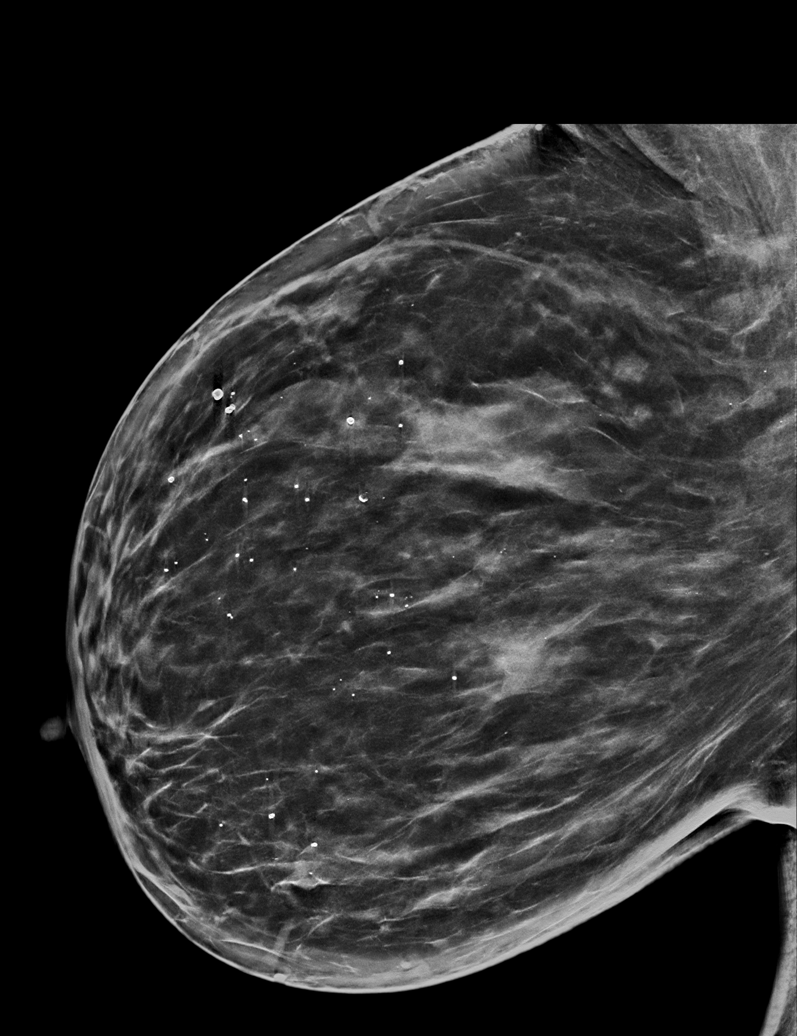

[L CC synth-2D]
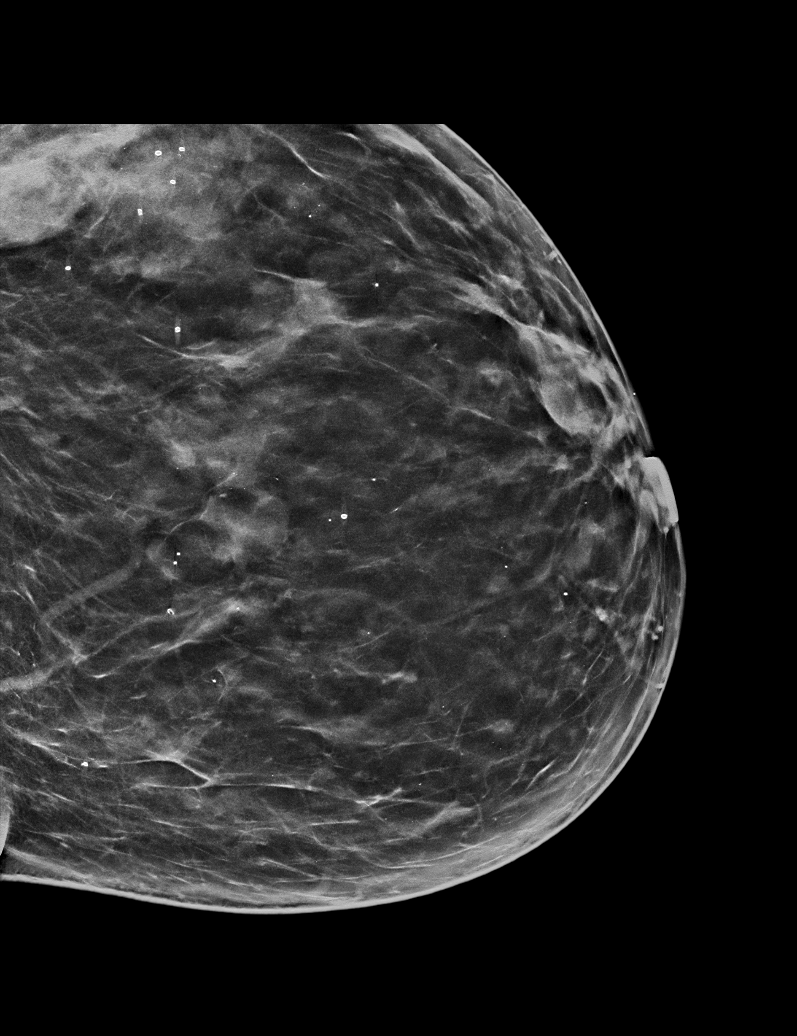

[L MLO synth-2D (1 of 2)]
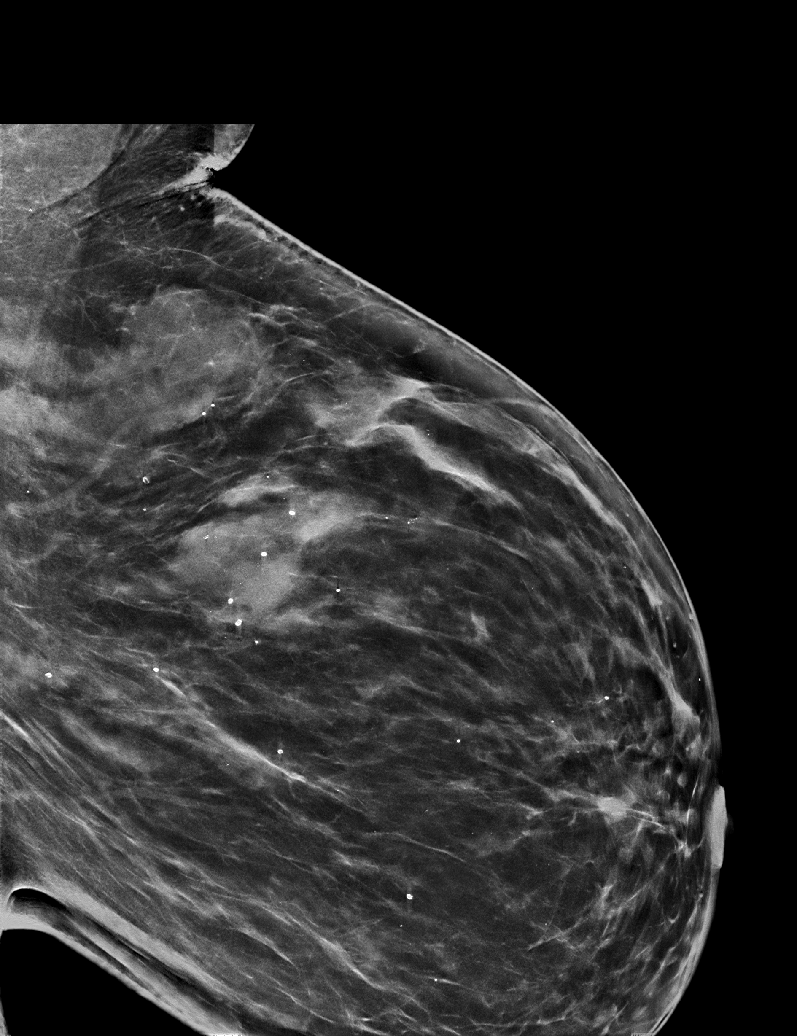

[L MLO synth-2D (2 of 2)]
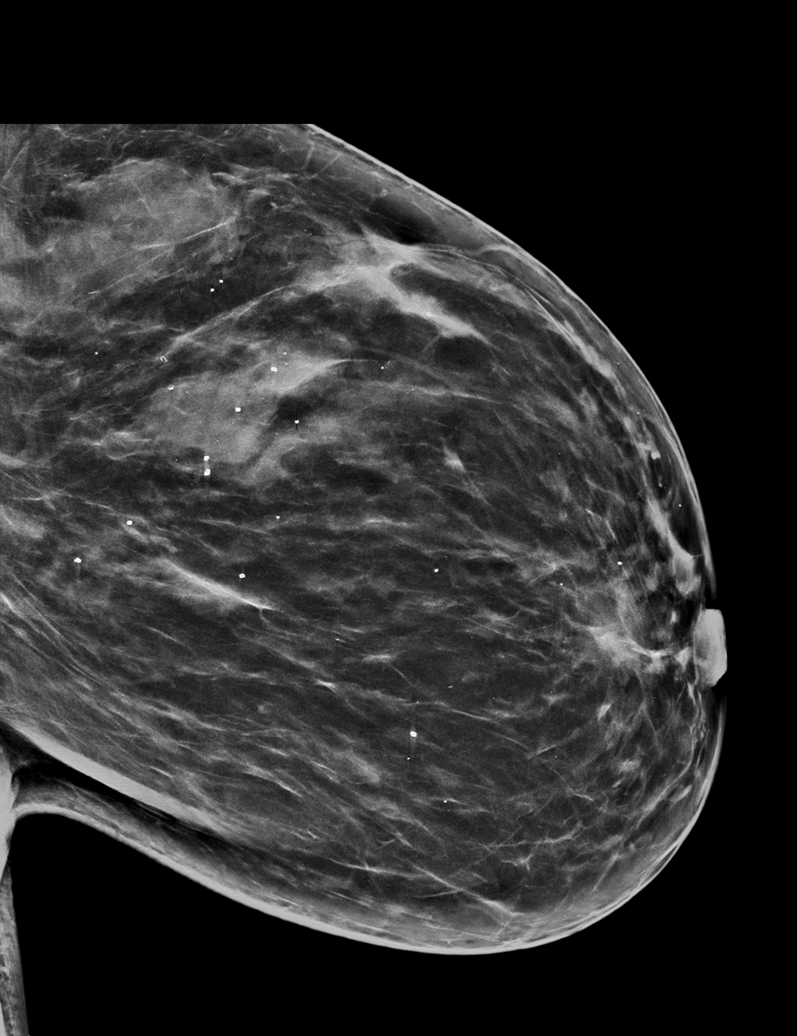

[R CC synth-2D]
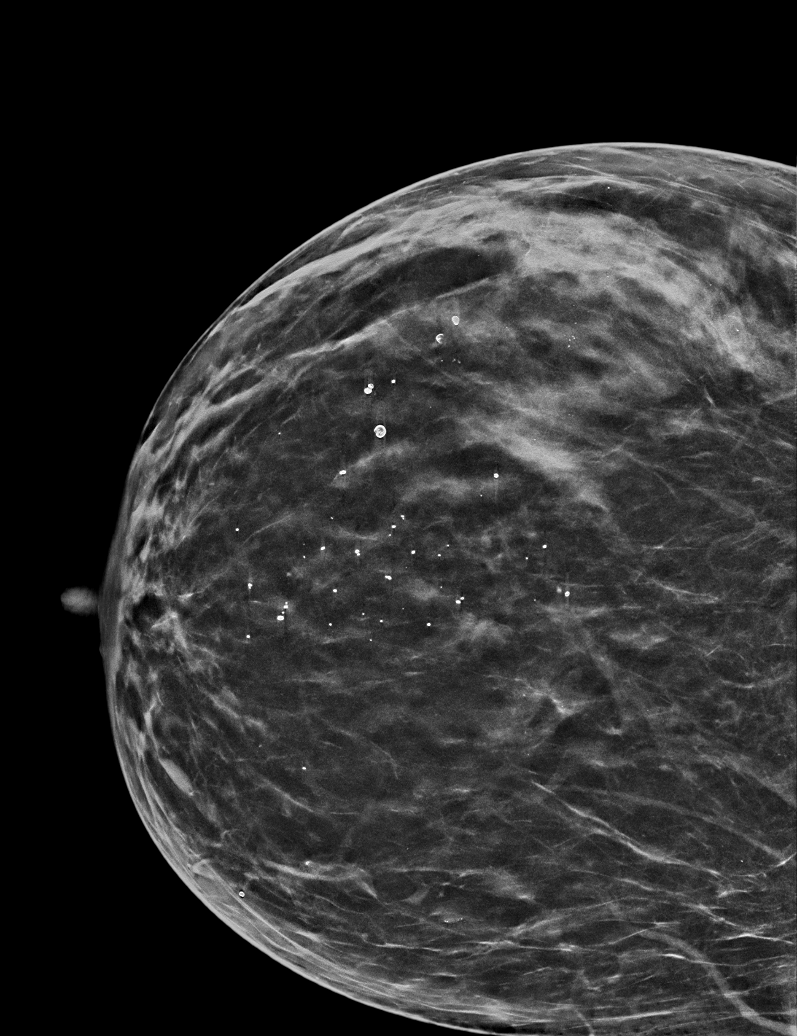

[R MLO tomo · tomo slice 37/72.0]
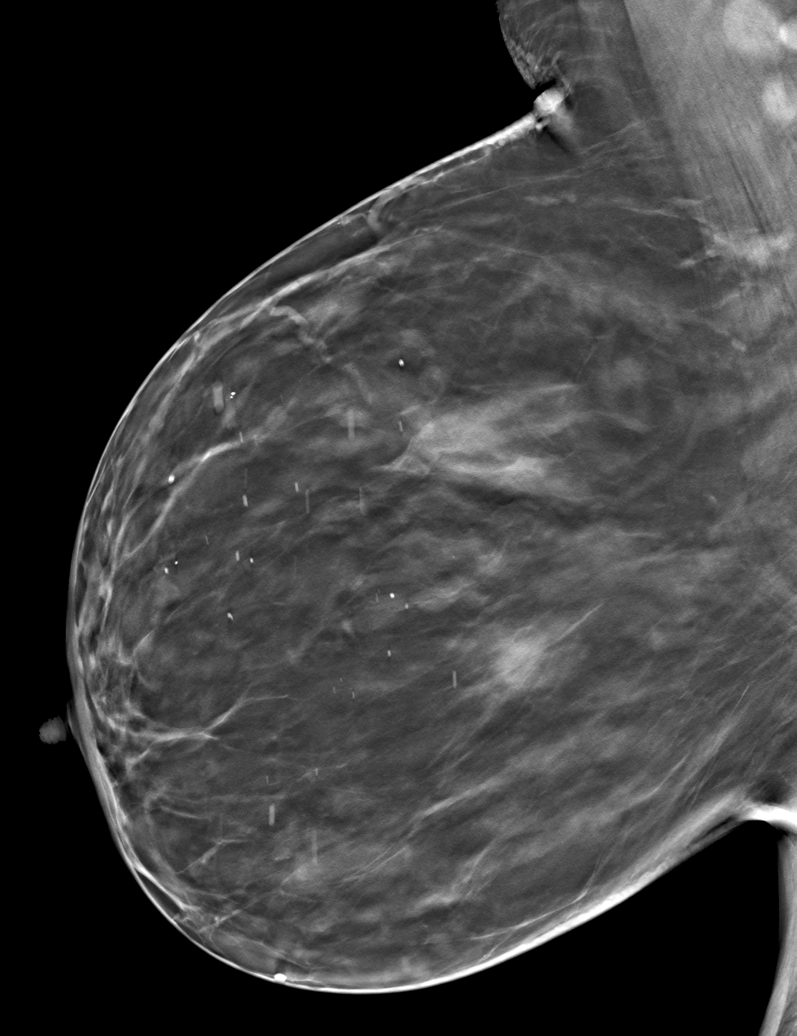

[6 of 30 positions shown; findings below may reference images not displayed]

ACR Breast Density Category c: The breast tissue is heterogeneously
dense, which may obscure small masses.
FINDINGS: There are no findings suspicious for malignancy. Images were
processed with CAD.
IMPRESSION: No mammographic evidence of malignancy. A result letter of this
screening mammogram will be mailed directly to the patient.

RECOMMENDATION:
Screening mammogram in one year. (Code:[id number])

BI-RADS CATEGORY  1: Negative.

## 2019-10-16 ENCOUNTER — Encounter (INDEPENDENT_AMBULATORY_CARE_PROVIDER_SITE_OTHER): Payer: Medicare Other | Admitting: Ophthalmology

## 2019-10-27 ENCOUNTER — Other Ambulatory Visit: Payer: Self-pay | Admitting: Family Medicine

## 2019-10-27 DIAGNOSIS — Z1231 Encounter for screening mammogram for malignant neoplasm of breast: Secondary | ICD-10-CM

## 2019-12-11 ENCOUNTER — Encounter (INDEPENDENT_AMBULATORY_CARE_PROVIDER_SITE_OTHER): Payer: Self-pay | Admitting: Ophthalmology

## 2019-12-11 ENCOUNTER — Encounter (INDEPENDENT_AMBULATORY_CARE_PROVIDER_SITE_OTHER): Payer: Medicare Other | Admitting: Ophthalmology

## 2019-12-11 ENCOUNTER — Other Ambulatory Visit: Payer: Self-pay

## 2019-12-11 ENCOUNTER — Ambulatory Visit (INDEPENDENT_AMBULATORY_CARE_PROVIDER_SITE_OTHER): Payer: Medicare Other | Admitting: Ophthalmology

## 2019-12-11 DIAGNOSIS — H26491 Other secondary cataract, right eye: Secondary | ICD-10-CM

## 2019-12-11 DIAGNOSIS — H3341 Traction detachment of retina, right eye: Secondary | ICD-10-CM

## 2019-12-11 DIAGNOSIS — H43812 Vitreous degeneration, left eye: Secondary | ICD-10-CM

## 2019-12-11 DIAGNOSIS — H35371 Puckering of macula, right eye: Secondary | ICD-10-CM

## 2019-12-11 DIAGNOSIS — H35351 Cystoid macular degeneration, right eye: Secondary | ICD-10-CM | POA: Diagnosis not present

## 2019-12-11 NOTE — Assessment & Plan Note (Signed)
Resolved post YAG

## 2019-12-11 NOTE — Assessment & Plan Note (Signed)

## 2019-12-11 NOTE — Assessment & Plan Note (Signed)
The pucker on the right eye is a fold directly over the fovea and is clearly preretinal as vessels are covered by this tissue.  This tissue is now stabilized no peripheral attachments nor proliferative vitreoretinopathy

## 2019-12-11 NOTE — Progress Notes (Signed)
12/11/2019     CHIEF COMPLAINT Patient presents for Retina Follow Up   HISTORY OF PRESENT ILLNESS: Tina Hubbard is a 74 y.o. female who presents to the clinic today for:   HPI    Retina Follow Up    Patient presents with  Other.  In both eyes.  This started 5 months ago.  Severity is mild.  Duration of 5 months.  Since onset it is stable.          Comments    5 Month F/U OU  Pt sts she had YAG laser OD, but it did not improve VA. Pt reports stable VA OU since last visit. No new symptoms reported OU.       Last edited by Rockie Neighbours, [place] on 12/11/2019  1:49 PM. (History)      Referring physician: Dion Body, MD [place] [facility] [place],  [place] [id number]  HISTORICAL INFORMATION:   Selected notes from the MEDICAL RECORD NUMBER       CURRENT MEDICATIONS: Current Outpatient Medications (Ophthalmic Drugs)  Medication Sig  . ketorolac (ACULAR) 0.5 % ophthalmic solution Place 1 drop into the left eye 3 (three) times daily. (Patient not taking: Reported on 12/11/2019)   No current facility-administered medications for this visit. (Ophthalmic Drugs)   Current Outpatient Medications (Other)  Medication Sig  . amLODipine (NORVASC) 5 MG tablet Take 5 mg by mouth daily.   Marland Kitchen atorvastatin (LIPITOR) 20 MG tablet Take 20 mg by mouth daily.   Marland Kitchen b complex vitamins tablet Take 1 tablet by mouth daily.  . busPIRone (BUSPAR) 10 MG tablet Take 10 mg by mouth daily.   . busPIRone (BUSPAR) 10 MG tablet SMARTSIG:1 Tablet(s) By Mouth Twice Daily  . celecoxib (CELEBREX) 200 MG capsule Take 200 mg by mouth 2 (two) times daily.  . DULoxetine (CYMBALTA) 60 MG capsule Take 60 mg by mouth daily.  Marland Kitchen estradiol (ESTRACE) 1 MG tablet Take 1 mg by mouth daily.  Marland Kitchen etanercept (ENBREL SURECLICK) 50 MG/ML injection Inject into the skin.  Marland Kitchen gabapentin (NEURONTIN) 300 MG capsule Take 300-600 mg by mouth See admin instructions. Take 600 mg by mouth in the morning, 300 mg  midday and 600 mg at bedtime  . methocarbamol (ROBAXIN) 750 MG tablet Take 1 tablet (750 mg total) by mouth 4 (four) times daily.  Marland Kitchen omeprazole (PRILOSEC) 20 MG capsule Take 20 mg by mouth daily.   Marland Kitchen oxyCODONE (OXY IR/ROXICODONE) 5 MG immediate release tablet Take 1 tablet (5 mg total) by mouth every 3 (three) hours as needed for moderate pain ((score 4 to 6)).  Marland Kitchen tiZANidine (ZANAFLEX) 2 MG tablet Take by mouth.  . traZODone (DESYREL) 50 MG tablet Take 50 mg by mouth at bedtime.   . valsartan-hydrochlorothiazide (DIOVAN-HCT) 320-12.5 MG tablet Take 1 tablet by mouth daily.    No current facility-administered medications for this visit. (Other)      REVIEW OF SYSTEMS:    ALLERGIES Allergies  Allergen Reactions  . Hydrocodone Nausea And Vomiting  . Oxycodone Other (See Comments)    Caused jitteriness and crying spells    PAST MEDICAL HISTORY Past Medical History:  Diagnosis Date  . Adenoma of colon   . Anxiety   . Arthritis    Psoriatic  . Coronary artery disease   . DDD (degenerative disc disease), lumbar   . Depression   . Detached retina    has a sown in oil bubble in eye since Jan  17th  . GERD (gastroesophageal reflux disease)   . Hyperlipidemia   . Hypertension   . Left leg pain    occasional numbness, R/T "slipped disc" above fusion  . Osteoarthritis   . Pre-diabetes   . Right posterior capsular opacification 07/16/2019   Past Surgical History:  Procedure Laterality Date  . ABDOMINAL HYSTERECTOMY    . ANGIOPLASTY    . BACK SURGERY  2015   SPINAL FUSION [facility]  . BILATERAL CARPAL TUNNEL RELEASE Bilateral   . BREAST BIOPSY Left yrs ago   EXCISIONAL X 2 - NEG  . CARPAL TUNNEL RELEASE Left 02/27/2018   Procedure: CARPAL TUNNEL RELEASE;  Surgeon: Corky Mull, MD;  Location: [facility];  Service: Orthopedics;  Laterality: Left;  . CATARACT EXTRACTION Bilateral    Dr. Talbert Forest  . CHOLECYSTECTOMY    . COLONOSCOPY    . COLONOSCOPY WITH  PROPOFOL N/A 01/10/2017   Procedure: COLONOSCOPY WITH PROPOFOL;  Surgeon: Manya Silvas, MD;  Location: [facility] ENDOSCOPY;  Service: Endoscopy;  Laterality: N/A;  . EYE SURGERY Bilateral    Cataract Extraction with IOL  . FLEXIBLE SIGMOIDOSCOPY    . JOINT REPLACEMENT Bilateral 2003   TOTAL KNEE REPLACEMENTS DR CALIFF [facility]  . LUMBAR LAMINECTOMY    . POSTERIOR LUMBAR FUSION 4 LEVEL N/A 09/04/2018   Procedure: L3-4 XLIF, L3-S1 PSF (MIS);  Surgeon: Meade Maw, MD;  Location: [facility] ORS;  Service: Neurosurgery;  Laterality: N/A;  . RETINAL DETACHMENT SURGERY    . SHOULDER ARTHROSCOPY WITH OPEN ROTATOR CUFF REPAIR Right 09/19/2016   Procedure: SHOULDER ARTHROSCOPY WITH MINI OPEN ROTATOR CUFF REPAIR;  Surgeon: Corky Mull, MD;  Location: [facility] ORS;  Service: Orthopedics;  Laterality: Right;  Biceps tenolysis  . TENNIS ELBOW RELEASE/NIRSCHEL PROCEDURE Left 02/27/2018   Procedure: SUBCUTANEOUS ANTERIOR TRANSPOSITION OF TH EULNAR NERVE AT THE ELBOW;  Surgeon: Corky Mull, MD;  Location: [facility];  Service: Orthopedics;  Laterality: Left;  . TRIGGER FINGER RELEASE    . ULNAR NERVE TRANSPOSITION Left 02/21/2017   Procedure: ULNAR NERVE DECOMPRESSION WRIST GUYON'S CANAL;  Surgeon: Corky Mull, MD;  Location: [facility];  Service: Orthopedics;  Laterality: Left;    FAMILY HISTORY Family History  Problem Relation Age of Onset  . Uterine cancer Mother 86  . Diabetes Mother   . Diabetes Sister   . Breast cancer Neg Hx     SOCIAL HISTORY Social History   Tobacco Use  . Smoking status: Never Smoker  . Smokeless tobacco: Never Used  Vaping Use  . Vaping Use: Never used  Substance Use Topics  . Alcohol use: Yes    Comment: glass of wine per month  . Drug use: No         OPHTHALMIC EXAM:  Base Eye Exam    Visual Acuity (ETDRS)      Right Left   Dist cc 20/200 20/25 -1   Dist ph cc NI    Correction: Glasses       Tonometry (Tonopen, 1:51 PM)      Right  Left   Pressure 06 13       Pupils      Dark Light Shape React APD   Right 4 4 Irregular Minimal None   Left 4 3 Round Brisk None       Visual Fields (Counting fingers)      Left Right    Full    Restrictions  Total superior temporal, inferior temporal,  superior nasal deficiencies       Extraocular Movement      Right Left    Full Full       Neuro/Psych    Oriented x3: Yes   Mood/Affect: Normal       Dilation    Both eyes: 1.0% Mydriacyl, 2.5% Phenylephrine @ 1:54 PM        Slit Lamp and Fundus Exam    External Exam      Right Left   External Normal Normal       Slit Lamp Exam      Right Left   Lids/Lashes Normal    Conjunctiva/Sclera White and quiet    Cornea Clear    Anterior Chamber Deep and quiet    Iris Round and reactive    Lens Posterior chamber intraocular lens, 3+ Posterior capsular opacification Posterior chamber intraocular lens   Anterior Vitreous Clear vitrectomized        Fundus Exam      Right Left   Posterior Vitreous Vitrectomized Posterior vitreous detachment   Disc Normal Normal   C/D Ratio 0.4 0.35   Macula Epiretinal membrane with moderate to severe topographic distortion,  Normal   Vessels Normal Normal   Periphery No retinal scarring, retinal detached peripherally in the macula is a attached. No peripheral holes or tears left eye          IMAGING AND PROCEDURES  Imaging and Procedures for 12/11/19  OCT, Retina - OU - Both Eyes       Right Eye Scan locations included temporal. Progression has been stable. Findings include epiretinal membrane.   Left Eye Quality was good. Scan locations included subfoveal. Central Foveal Thickness: 279. Progression has been stable.   Notes OS with posterior vitreous detachment, normal macula  OD with epiretinal membrane and some topographic distortion to the macula                ASSESSMENT/PLAN:  Right epiretinal membrane The pucker on the right eye is a fold directly over  the fovea and is clearly preretinal as vessels are covered by this tissue.  This tissue is now stabilized no peripheral attachments nor proliferative vitreoretinopathy  Right posterior capsular opacification Resolved post YAG  Cystoid macular edema of right eye Minor, improved and resolved  Posterior vitreous detachment, left eye   The nature of posterior vitreous detachment was discussed with the patient as well as its physiology, its age prevalence, and its possible implication regarding retinal breaks and detachment.  An informational brochure was given to the patient.  All the patient's questions were answered.  The patient was asked to return if new or different flashes or floaters develops.   Patient was instructed to contact office immediately if any changes were noticed. I explained to the patient that vitreous inside the eye is similar to jello inside a bowl. As the jello melts it can start to pull away from the bowl, similarly the vitreous throughout our lives can begin to pull away from the retina. That process is called a posterior vitreous detachment. In some cases, the vitreous can tug hard enough on the retina to form a retinal tear. I discussed with the patient the signs and symptoms of a retinal detachment.  Do not rub the eye.      ICD-10-CM   1. Right epiretinal membrane  H35.371 OCT, Retina - OU - Both Eyes  2. Right posterior capsular opacification  H26.491   3. Traction detachment of right  retina  H33.41   4. Cystoid macular edema of right eye  H35.351   5. Posterior vitreous detachment, left eye  H43.812     1.  Upon the next visit we will discuss the possibility of vitrectomy, membrane peel over the macula right eye color fundus photography with the patient will include the husband at that visit 2.  3.  Ophthalmic Meds Ordered this visit:  No orders of the defined types were placed in this encounter.      Return in about 6 months (around 06/09/2020) for DILATE  OU, COLOR FP, OCT.  There are no Patient Instructions on file for this visit.   Explained the diagnoses, plan, and follow up with the patient and they expressed understanding.  Patient expressed understanding of the importance of proper follow up care.   Clent Demark Armanda Forand M.D. Diseases & Surgery of the Retina and Vitreous [facility] [place] 12/11/19     Abbreviations: M myopia (nearsighted); A astigmatism; H hyperopia (farsighted); P presbyopia; Mrx spectacle prescription;  CTL contact lenses; OD right eye; OS left eye; OU both eyes  XT exotropia; ET esotropia; PEK punctate epithelial keratitis; PEE punctate epithelial erosions; DES dry eye syndrome; MGD meibomian gland dysfunction; ATs artificial tears; PFAT's preservative free artificial tears; [place] nuclear sclerotic cataract; PSC posterior subcapsular cataract; ERM epi-retinal membrane; PVD posterior vitreous detachment; RD retinal detachment; DM diabetes mellitus; DR diabetic retinopathy; NPDR non-proliferative diabetic retinopathy; PDR proliferative diabetic retinopathy; CSME clinically significant macular edema; DME diabetic macular edema; dbh dot blot hemorrhages; CWS cotton wool spot; POAG primary open angle glaucoma; C/D cup-to-disc ratio; HVF humphrey visual field; GVF goldmann visual field; OCT optical coherence tomography; IOP intraocular pressure; BRVO Branch retinal vein occlusion; CRVO central retinal vein occlusion; CRAO central retinal artery occlusion; BRAO branch retinal artery occlusion; RT retinal tear; SB scleral buckle; PPV pars plana vitrectomy; VH Vitreous hemorrhage; PRP panretinal laser photocoagulation; IVK intravitreal kenalog; VMT vitreomacular traction; MH Macular hole;  NVD neovascularization of the disc; NVE neovascularization elsewhere; AREDS age related eye disease study; ARMD age related macular degeneration; POAG primary open angle glaucoma; EBMD epithelial/anterior basement membrane dystrophy; ACIOL  anterior chamber intraocular lens; IOL intraocular lens; PCIOL posterior chamber intraocular lens; Phaco/IOL phacoemulsification with intraocular lens placement; [place] photorefractive keratectomy; LASIK laser assisted in situ keratomileusis; HTN hypertension; DM diabetes mellitus; COPD chronic obstructive pulmonary disease

## 2019-12-11 NOTE — Assessment & Plan Note (Signed)
Minor, improved and resolved

## 2020-01-19 ENCOUNTER — Other Ambulatory Visit: Payer: Self-pay

## 2020-01-19 ENCOUNTER — Ambulatory Visit
Admission: RE | Admit: 2020-01-19 | Discharge: 2020-01-19 | Disposition: A | Discharge status: Home / Self Care | Payer: Medicare Other | Source: Ambulatory Visit | Attending: Family Medicine | Admitting: Family Medicine

## 2020-01-19 DIAGNOSIS — Z1231 Encounter for screening mammogram for malignant neoplasm of breast: Secondary | ICD-10-CM | POA: Diagnosis present

## 2020-01-23 ENCOUNTER — Other Ambulatory Visit: Payer: Self-pay | Admitting: Family Medicine

## 2020-01-23 DIAGNOSIS — R928 Other abnormal and inconclusive findings on diagnostic imaging of breast: Secondary | ICD-10-CM

## 2020-01-23 DIAGNOSIS — N6489 Other specified disorders of breast: Secondary | ICD-10-CM

## 2020-02-02 ENCOUNTER — Other Ambulatory Visit: Payer: Self-pay | Admitting: Nurse Practitioner

## 2020-02-02 ENCOUNTER — Other Ambulatory Visit (HOSPITAL_COMMUNITY): Payer: Self-pay | Admitting: Nurse Practitioner

## 2020-02-02 DIAGNOSIS — M5416 Radiculopathy, lumbar region: Secondary | ICD-10-CM

## 2020-02-02 DIAGNOSIS — M545 Low back pain, unspecified: Secondary | ICD-10-CM

## 2020-02-02 DIAGNOSIS — G8929 Other chronic pain: Secondary | ICD-10-CM

## 2020-02-03 ENCOUNTER — Ambulatory Visit
Admission: RE | Admit: 2020-02-03 | Discharge: 2020-02-03 | Disposition: A | Discharge status: Home / Self Care | Payer: Medicare Other | Source: Ambulatory Visit | Attending: Family Medicine | Admitting: Family Medicine

## 2020-02-03 ENCOUNTER — Other Ambulatory Visit: Payer: Self-pay

## 2020-02-03 DIAGNOSIS — N6489 Other specified disorders of breast: Secondary | ICD-10-CM

## 2020-02-03 DIAGNOSIS — R928 Other abnormal and inconclusive findings on diagnostic imaging of breast: Secondary | ICD-10-CM

## 2020-02-05 ENCOUNTER — Other Ambulatory Visit: Payer: Self-pay | Admitting: Family Medicine

## 2020-02-05 DIAGNOSIS — N6310 Unspecified lump in the right breast, unspecified quadrant: Secondary | ICD-10-CM

## 2020-02-05 DIAGNOSIS — R928 Other abnormal and inconclusive findings on diagnostic imaging of breast: Secondary | ICD-10-CM

## 2020-02-10 ENCOUNTER — Ambulatory Visit
Admission: RE | Admit: 2020-02-10 | Discharge: 2020-02-10 | Disposition: A | Discharge status: Home / Self Care | Payer: Medicare Other | Source: Ambulatory Visit | Attending: Family Medicine | Admitting: Family Medicine

## 2020-02-10 ENCOUNTER — Other Ambulatory Visit: Payer: Self-pay

## 2020-02-10 DIAGNOSIS — N6011 Diffuse cystic mastopathy of right breast: Secondary | ICD-10-CM | POA: Diagnosis not present

## 2020-02-10 DIAGNOSIS — R928 Other abnormal and inconclusive findings on diagnostic imaging of breast: Secondary | ICD-10-CM

## 2020-02-10 DIAGNOSIS — N6310 Unspecified lump in the right breast, unspecified quadrant: Secondary | ICD-10-CM

## 2020-02-10 HISTORY — PX: BREAST BIOPSY: SHX20

## 2020-02-11 LAB — SURGICAL PATHOLOGY

## 2020-02-12 ENCOUNTER — Ambulatory Visit
Admission: RE | Admit: 2020-02-12 | Discharge: 2020-02-12 | Disposition: A | Discharge status: Home / Self Care | Payer: Medicare Other | Source: Ambulatory Visit | Attending: Nurse Practitioner | Admitting: Nurse Practitioner

## 2020-02-12 ENCOUNTER — Other Ambulatory Visit: Payer: Self-pay

## 2020-02-12 DIAGNOSIS — M545 Low back pain, unspecified: Secondary | ICD-10-CM | POA: Diagnosis present

## 2020-02-12 DIAGNOSIS — G8929 Other chronic pain: Secondary | ICD-10-CM | POA: Diagnosis present

## 2020-02-12 DIAGNOSIS — M5416 Radiculopathy, lumbar region: Secondary | ICD-10-CM

## 2020-03-15 ENCOUNTER — Other Ambulatory Visit: Payer: Self-pay | Admitting: Family Medicine

## 2020-03-15 DIAGNOSIS — N6489 Other specified disorders of breast: Secondary | ICD-10-CM

## 2020-06-10 ENCOUNTER — Encounter (INDEPENDENT_AMBULATORY_CARE_PROVIDER_SITE_OTHER): Payer: Medicare Other | Admitting: Ophthalmology

## 2020-06-21 ENCOUNTER — Ambulatory Visit (INDEPENDENT_AMBULATORY_CARE_PROVIDER_SITE_OTHER): Payer: Medicare Other | Admitting: Ophthalmology

## 2020-06-21 ENCOUNTER — Encounter (INDEPENDENT_AMBULATORY_CARE_PROVIDER_SITE_OTHER): Payer: Self-pay | Admitting: Ophthalmology

## 2020-06-21 ENCOUNTER — Other Ambulatory Visit: Payer: Self-pay

## 2020-06-21 DIAGNOSIS — Z8669 Personal history of other diseases of the nervous system and sense organs: Secondary | ICD-10-CM

## 2020-06-21 DIAGNOSIS — H3341 Traction detachment of retina, right eye: Secondary | ICD-10-CM | POA: Diagnosis not present

## 2020-06-21 DIAGNOSIS — H35351 Cystoid macular degeneration, right eye: Secondary | ICD-10-CM | POA: Diagnosis not present

## 2020-06-21 DIAGNOSIS — H43812 Vitreous degeneration, left eye: Secondary | ICD-10-CM

## 2020-06-21 DIAGNOSIS — H35371 Puckering of macula, right eye: Secondary | ICD-10-CM | POA: Diagnosis not present

## 2020-06-21 NOTE — Assessment & Plan Note (Signed)
Minor OS no new holes

## 2020-06-21 NOTE — Patient Instructions (Signed)
Patient instructed to contact the office promptly for new onset visual acuity declines or distortions 

## 2020-06-21 NOTE — Progress Notes (Signed)
06/21/2020     CHIEF COMPLAINT Patient presents for Retina Follow Up (6 Mo F/U OU//Pt reports intermittent HA over OD. Pt denies changes to [facility].)   HISTORY OF PRESENT ILLNESS: Tina Hubbard is a 75 y.o. female who presents to the clinic today for:   HPI    Retina Follow Up    Patient presents with  Other.  In both eyes.  This started 6 months ago.  Severity is mild.  Duration of 6 months.  Since onset it is stable. Additional comments: 6 Mo F/U OU  Pt reports intermittent HA over OD. Pt denies changes to [place].       Last edited by Rockie Neighbours, [place] on 06/21/2020  1:41 PM. (History)      Referring physician: Dion Body, MD [facility] [place],  [place] [address]  HISTORICAL INFORMATION:   Selected notes from the MEDICAL RECORD NUMBER       CURRENT MEDICATIONS: Current Outpatient Medications (Ophthalmic Drugs)  Medication Sig  . ketorolac (ACULAR) 0.5 % ophthalmic solution Place 1 drop into the left eye 3 (three) times daily. (Patient not taking: No sig reported)   No current facility-administered medications for this visit. (Ophthalmic Drugs)   Current Outpatient Medications (Other)  Medication Sig  . amLODipine (NORVASC) 5 MG tablet Take 5 mg by mouth daily.   Marland Kitchen atorvastatin (LIPITOR) 20 MG tablet Take 20 mg by mouth daily.   Marland Kitchen b complex vitamins tablet Take 1 tablet by mouth daily.  . busPIRone (BUSPAR) 10 MG tablet Take 10 mg by mouth daily.   . busPIRone (BUSPAR) 10 MG tablet SMARTSIG:1 Tablet(s) By Mouth Twice Daily  . celecoxib (CELEBREX) 200 MG capsule Take 200 mg by mouth 2 (two) times daily.  . DULoxetine (CYMBALTA) 60 MG capsule Take 60 mg by mouth daily.  Marland Kitchen estradiol (ESTRACE) 1 MG tablet Take 1 mg by mouth daily.  Marland Kitchen etanercept (ENBREL SURECLICK) 50 MG/ML injection Inject into the skin.  Marland Kitchen gabapentin (NEURONTIN) 300 MG capsule Take 300-600 mg by mouth See admin instructions. Take 600 mg by mouth in the morning, 300  mg midday and 600 mg at bedtime  . methocarbamol (ROBAXIN) 750 MG tablet Take 1 tablet (750 mg total) by mouth 4 (four) times daily.  Marland Kitchen omeprazole (PRILOSEC) 20 MG capsule Take 20 mg by mouth daily.   Marland Kitchen oxyCODONE (OXY IR/ROXICODONE) 5 MG immediate release tablet Take 1 tablet (5 mg total) by mouth every 3 (three) hours as needed for moderate pain ((score 4 to 6)).  Marland Kitchen traZODone (DESYREL) 50 MG tablet Take 50 mg by mouth at bedtime.   . valsartan-hydrochlorothiazide (DIOVAN-HCT) 320-12.5 MG tablet Take 1 tablet by mouth daily.    No current facility-administered medications for this visit. (Other)      REVIEW OF SYSTEMS:    ALLERGIES Allergies  Allergen Reactions  . Hydrocodone Nausea And Vomiting  . Oxycodone Other (See Comments)    Caused jitteriness and crying spells    PAST MEDICAL HISTORY Past Medical History:  Diagnosis Date  . Adenoma of colon   . Anxiety   . Arthritis    Psoriatic  . Coronary artery disease   . Cystoid macular edema of right eye 07/16/2019  . DDD (degenerative disc disease), lumbar   . Depression   . Detached retina    has a sown in oil bubble in eye since Jan 17th  . GERD (gastroesophageal reflux disease)   . Hyperlipidemia   .  Hypertension   . Left leg pain    occasional numbness, R/T "slipped disc" above fusion  . Osteoarthritis   . Pre-diabetes   . Right posterior capsular opacification 07/16/2019   Past Surgical History:  Procedure Laterality Date  . ABDOMINAL HYSTERECTOMY    . ANGIOPLASTY    . BACK SURGERY  2015   SPINAL FUSION [facility]  . BILATERAL CARPAL TUNNEL RELEASE Bilateral   . BREAST BIOPSY Left yrs ago   EXCISIONAL X 2 - NEG  . BREAST BIOPSY Right 02/10/2020   [place] Bx, vision clip, path pending   . CARPAL TUNNEL RELEASE Left 02/27/2018   Procedure: CARPAL TUNNEL RELEASE;  Surgeon: Corky Mull, MD;  Location: [place];  Service: Orthopedics;  Laterality: Left;  . CATARACT EXTRACTION Bilateral    Dr.  Talbert Forest  . CHOLECYSTECTOMY    . COLONOSCOPY    . COLONOSCOPY WITH PROPOFOL N/A 01/10/2017   Procedure: COLONOSCOPY WITH PROPOFOL;  Surgeon: Manya Silvas, MD;  Location: [facility] ENDOSCOPY;  Service: Endoscopy;  Laterality: N/A;  . EYE SURGERY Bilateral    Cataract Extraction with IOL  . FLEXIBLE SIGMOIDOSCOPY    . JOINT REPLACEMENT Bilateral 2003   TOTAL KNEE REPLACEMENTS DR CALIFF [facility]  . LUMBAR LAMINECTOMY    . POSTERIOR LUMBAR FUSION 4 LEVEL N/A 09/04/2018   Procedure: L3-4 XLIF, L3-S1 PSF (MIS);  Surgeon: Meade Maw, MD;  Location: [facility] ORS;  Service: Neurosurgery;  Laterality: N/A;  . RETINAL DETACHMENT SURGERY    . SHOULDER ARTHROSCOPY WITH OPEN ROTATOR CUFF REPAIR Right 09/19/2016   Procedure: SHOULDER ARTHROSCOPY WITH MINI OPEN ROTATOR CUFF REPAIR;  Surgeon: Corky Mull, MD;  Location: [facility] ORS;  Service: Orthopedics;  Laterality: Right;  Biceps tenolysis  . TENNIS ELBOW RELEASE/NIRSCHEL PROCEDURE Left 02/27/2018   Procedure: SUBCUTANEOUS ANTERIOR TRANSPOSITION OF TH EULNAR NERVE AT THE ELBOW;  Surgeon: Corky Mull, MD;  Location: [place];  Service: Orthopedics;  Laterality: Left;  . TRIGGER FINGER RELEASE    . ULNAR NERVE TRANSPOSITION Left 02/21/2017   Procedure: ULNAR NERVE DECOMPRESSION WRIST GUYON'S CANAL;  Surgeon: Corky Mull, MD;  Location: [place];  Service: Orthopedics;  Laterality: Left;    FAMILY HISTORY Family History  Problem Relation Age of Onset  . Uterine cancer Mother 16  . Diabetes Mother   . Diabetes Sister   . Breast cancer Neg Hx     SOCIAL HISTORY Social History   Tobacco Use  . Smoking status: Never Smoker  . Smokeless tobacco: Never Used  Vaping Use  . Vaping Use: Never used  Substance Use Topics  . Alcohol use: Yes    Comment: glass of wine per month  . Drug use: No         OPHTHALMIC EXAM: Base Eye Exam    Visual Acuity (ETDRS)      Right Left   Dist cc 20/400 20/20 -1   Dist ph cc 20/200     Correction: Glasses       Tonometry (Tonopen, 1:42 PM)      Right Left   Pressure 06 16       Pupils      Dark Light Shape React APD   Right 5 5 Irregular Minimal None   Left 4.5 3.5 Round Brisk None       Visual Fields (Counting fingers)      Left Right    Full    Restrictions  Total superior temporal, inferior temporal,  superior nasal deficiencies       Extraocular Movement      Right Left    Full Full       Neuro/Psych    Oriented x3: Yes   Mood/Affect: Normal       Dilation    Both eyes: 1.0% Mydriacyl, 2.5% Phenylephrine @ 1:45 PM        Slit Lamp and Fundus Exam    External Exam      Right Left   External Normal Normal       Slit Lamp Exam      Right Left   Lids/Lashes Normal    Conjunctiva/Sclera White and quiet    Cornea Clear    Anterior Chamber Deep and quiet    Iris Round and reactive    Lens Posterior chamber intraocular lens, 3+ Posterior capsular opacification Posterior chamber intraocular lens   Anterior Vitreous Clear vitrectomized        Fundus Exam      Right Left   Posterior Vitreous Vitrectomized, clear Posterior vitreous detachment   Disc Normal Normal   C/D Ratio 0.4 0.35   Macula Epiretinal membrane with moderate to severe topographic distortion,  Normal   Vessels Normal Normal   Periphery No retinal scarring, retinal detached peripherally well surrounded and demarcated by previous laser photocoagulation,,    the macula is a attached. No peripheral holes or tears left eye          IMAGING AND PROCEDURES  Imaging and Procedures for 06/21/20  OCT, Retina - OU - Both Eyes       Right Eye Quality was good. Scan locations included temporal. Central Foveal Thickness: 335. Progression has been stable. Findings include epiretinal membrane, abnormal foveal contour.   Left Eye Quality was good. Scan locations included subfoveal. Central Foveal Thickness: 283. Progression has been stable. Findings include normal foveal contour.    Notes OS with posterior vitreous detachment, normal macula  OD with epiretinal membrane and some topographic distortion to the macula, with overall intact outer retinal elements in the foveal perifoveal region.       Color Fundus Photography Optos - OU - Both Eyes       Right Eye Progression has been stable. Disc findings include increased cup to disc ratio, pallor. Macula : epiretinal membrane. Vessels : normal observations. Periphery : normal observations.   Left Eye Progression has been stable.   Notes Severe epiretinal membrane, overlies the fovea.  Stable OD from previous PVR and silicone oil preop retinal proliferation a attached  OS normal                ASSESSMENT/PLAN:  Right epiretinal membrane Severe topographic distortion overlying the fovea.  Previous PVR controlled and stable, good laser retinal photocoagulation into the posterior pole with a attached posterior pole.  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  Some potential for visual acuity improvement given the intactness of the outer retinal photoreceptor elements noted by OCT.      ICD-10-CM   1. Right epiretinal membrane  H35.371 OCT, Retina - OU - Both Eyes  2. Traction detachment of right retina  H33.41 Color Fundus Photography Optos - OU - Both Eyes  3. Posterior vitreous detachment, left eye  H43.812   4.  Cystoid macular edema of right eye  H35.351   5. History of retinal detachment  Z86.69     1.  We will continue to observe right eye, epiretinal membrane over the foveal region.  2.  Posterior vitreous detachment left eye, no holes or tears.  3.  Ophthalmic Meds Ordered this visit:  No orders of the defined types were  placed in this encounter.      Return in about 6 months (around 12/22/2020) for DILATE OU, COLOR FP, OCT.  There are no Patient Instructions on file for this visit.   Explained the diagnoses, plan, and follow up with the patient and they expressed understanding.  Patient expressed understanding of the importance of proper follow up care.   Clent Demark Jnaya Butrick M.D. Diseases & Surgery of the Retina and Vitreous Retina & Diabetic [place] 06/21/20     Abbreviations: M myopia (nearsighted); A astigmatism; H hyperopia (farsighted); P presbyopia; Mrx spectacle prescription;  CTL contact lenses; OD right eye; OS left eye; OU both eyes  XT exotropia; ET esotropia; PEK punctate epithelial keratitis; PEE punctate epithelial erosions; DES dry eye syndrome; MGD meibomian gland dysfunction; ATs artificial tears; PFAT's preservative free artificial tears; [place] nuclear sclerotic cataract; PSC posterior subcapsular cataract; ERM epi-retinal membrane; PVD posterior vitreous detachment; RD retinal detachment; DM diabetes mellitus; DR diabetic retinopathy; NPDR non-proliferative diabetic retinopathy; PDR proliferative diabetic retinopathy; CSME clinically significant macular edema; DME diabetic macular edema; dbh dot blot hemorrhages; CWS cotton wool spot; POAG primary open angle glaucoma; C/D cup-to-disc ratio; HVF humphrey visual field; GVF goldmann visual field; OCT optical coherence tomography; IOP intraocular pressure; BRVO Branch retinal vein occlusion; CRVO central retinal vein occlusion; CRAO central retinal artery occlusion; BRAO branch retinal artery occlusion; RT retinal tear; SB scleral buckle; PPV pars plana vitrectomy; VH Vitreous hemorrhage; PRP panretinal laser photocoagulation; IVK intravitreal kenalog; VMT vitreomacular traction; MH Macular hole;  NVD neovascularization of the disc; NVE neovascularization elsewhere; AREDS age related eye disease study; ARMD age related macular degeneration; POAG  primary open angle glaucoma; EBMD epithelial/anterior basement membrane dystrophy; ACIOL anterior chamber intraocular lens; IOL intraocular lens; PCIOL posterior chamber intraocular lens; Phaco/IOL phacoemulsification with intraocular lens placement; [place] photorefractive keratectomy; LASIK laser assisted in situ keratomileusis; HTN hypertension; DM diabetes mellitus; COPD chronic obstructive pulmonary disease

## 2020-06-21 NOTE — Assessment & Plan Note (Signed)
Resolved OD

## 2020-06-21 NOTE — Assessment & Plan Note (Signed)
Severe topographic distortion overlying the fovea.  Previous PVR controlled and stable, good laser retinal photocoagulation into the posterior pole with a attached posterior pole.  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

## 2020-06-21 NOTE — Assessment & Plan Note (Signed)
Stable overall, secondary ERM present over the macular region.  Some potential for visual acuity improvement given the intactness of the outer retinal photoreceptor elements noted by OCT.

## 2020-08-10 ENCOUNTER — Ambulatory Visit
Admission: RE | Admit: 2020-08-10 | Discharge: 2020-08-10 | Disposition: A | Discharge status: Home / Self Care | Payer: Medicare Other | Source: Ambulatory Visit | Attending: Family Medicine | Admitting: Family Medicine

## 2020-08-10 ENCOUNTER — Other Ambulatory Visit: Payer: Self-pay

## 2020-08-10 DIAGNOSIS — N6489 Other specified disorders of breast: Secondary | ICD-10-CM | POA: Diagnosis present

## 2020-08-12 ENCOUNTER — Other Ambulatory Visit: Payer: Self-pay | Admitting: Family Medicine

## 2020-08-12 DIAGNOSIS — N6489 Other specified disorders of breast: Secondary | ICD-10-CM

## 2020-08-12 DIAGNOSIS — R928 Other abnormal and inconclusive findings on diagnostic imaging of breast: Secondary | ICD-10-CM

## 2020-08-24 ENCOUNTER — Ambulatory Visit
Admission: RE | Admit: 2020-08-24 | Discharge: 2020-08-24 | Disposition: A | Discharge status: Home / Self Care | Payer: Medicare Other | Source: Ambulatory Visit | Attending: Family Medicine | Admitting: Family Medicine

## 2020-08-24 ENCOUNTER — Other Ambulatory Visit: Payer: Self-pay

## 2020-08-24 DIAGNOSIS — R928 Other abnormal and inconclusive findings on diagnostic imaging of breast: Secondary | ICD-10-CM | POA: Diagnosis not present

## 2020-08-24 DIAGNOSIS — N6489 Other specified disorders of breast: Secondary | ICD-10-CM | POA: Diagnosis present

## 2020-08-24 HISTORY — PX: BREAST BIOPSY: SHX20

## 2020-08-25 LAB — SURGICAL PATHOLOGY

## 2020-10-22 ENCOUNTER — Other Ambulatory Visit: Payer: Self-pay | Admitting: Family Medicine

## 2020-10-22 ENCOUNTER — Ambulatory Visit
Admission: RE | Admit: 2020-10-22 | Discharge: 2020-10-22 | Disposition: A | Discharge status: Home / Self Care | Payer: Medicare Other | Source: Ambulatory Visit | Attending: Family Medicine | Admitting: Family Medicine

## 2020-10-22 ENCOUNTER — Other Ambulatory Visit (HOSPITAL_COMMUNITY): Payer: Self-pay | Admitting: Family Medicine

## 2020-10-22 ENCOUNTER — Other Ambulatory Visit: Payer: Self-pay

## 2020-10-22 ENCOUNTER — Emergency Department
Admission: EM | Admit: 2020-10-22 | Discharge: 2020-10-22 | Disposition: A | Discharge status: Home / Self Care | Payer: Medicare Other | Attending: Emergency Medicine | Admitting: Emergency Medicine

## 2020-10-22 ENCOUNTER — Ambulatory Visit: Payer: Medicare Other

## 2020-10-22 ENCOUNTER — Other Ambulatory Visit: Payer: Self-pay | Admitting: Radiology

## 2020-10-22 DIAGNOSIS — W010XXA Fall on same level from slipping, tripping and stumbling without subsequent striking against object, initial encounter: Secondary | ICD-10-CM | POA: Insufficient documentation

## 2020-10-22 DIAGNOSIS — Y92009 Unspecified place in unspecified non-institutional (private) residence as the place of occurrence of the external cause: Secondary | ICD-10-CM | POA: Insufficient documentation

## 2020-10-22 DIAGNOSIS — S0093XA Contusion of unspecified part of head, initial encounter: Secondary | ICD-10-CM | POA: Diagnosis not present

## 2020-10-22 DIAGNOSIS — S0990XA Unspecified injury of head, initial encounter: Secondary | ICD-10-CM

## 2020-10-22 DIAGNOSIS — Z5321 Procedure and treatment not carried out due to patient leaving prior to being seen by health care provider: Secondary | ICD-10-CM | POA: Insufficient documentation

## 2020-10-22 NOTE — ED Notes (Signed)
Pt notified registration staff that her head was no longer hurting and she was going to leave.  Pt ambulatory to exit per registration.

## 2020-10-22 NOTE — ED Triage Notes (Signed)
Pt presents to ER from home c/o fall.  Pt states she tripped over a box and fell around 0130 today.  Pt hit back of head and has hematoma noted to posterior head.  No LOC noted.  Pt not on blood thinners. Pt A&O x4 at this time with no deficits noted.

## 2020-11-08 ENCOUNTER — Other Ambulatory Visit: Payer: Self-pay | Admitting: Family Medicine

## 2020-11-08 DIAGNOSIS — Z1231 Encounter for screening mammogram for malignant neoplasm of breast: Secondary | ICD-10-CM

## 2020-12-23 ENCOUNTER — Encounter (INDEPENDENT_AMBULATORY_CARE_PROVIDER_SITE_OTHER): Payer: Medicare Other | Admitting: Ophthalmology

## 2021-01-06 ENCOUNTER — Ambulatory Visit (INDEPENDENT_AMBULATORY_CARE_PROVIDER_SITE_OTHER): Payer: Medicare Other | Admitting: Ophthalmology

## 2021-01-06 ENCOUNTER — Other Ambulatory Visit: Payer: Self-pay

## 2021-01-06 ENCOUNTER — Encounter (INDEPENDENT_AMBULATORY_CARE_PROVIDER_SITE_OTHER): Payer: Self-pay | Admitting: Ophthalmology

## 2021-01-06 DIAGNOSIS — H3341 Traction detachment of retina, right eye: Secondary | ICD-10-CM

## 2021-01-06 DIAGNOSIS — H35371 Puckering of macula, right eye: Secondary | ICD-10-CM

## 2021-01-06 DIAGNOSIS — H43812 Vitreous degeneration, left eye: Secondary | ICD-10-CM | POA: Diagnosis not present

## 2021-01-06 NOTE — Assessment & Plan Note (Signed)
The nature of macular pucker (epiretinal membrane ERM) was discussed with the patient as well as threshold criteria for vitrectomy surgery. I explained that in rare cases another surgery is needed to actually remove a second wrinkle should it regrow.  Most often, the epiretinal membrane and underlying wrinkled internal limiting membrane are removed with the first surgery, to accomplish the goals.   If the operative eye is [place] (natural lens still present), cataract surgery is often recommended prior to Vitrectomy. This will enable the retina surgeon to have the best view during surgery and the patient to obtain optimal results in the future. Treatment options were discussed.  I have recommended at home monitoring the near vision task in a monocular (1 eye at a time), with or without near vision glasses, to look for changes or declines in reading.  OD with moderate to severe topographic distortion from the epiretinal membrane over the fovea.  Outer retinal region in the fovea does appear intact and thus there is a moderate chance probably 50% chance of visual acuity improvement to the level 20/200 that she once had prior to the formation of the epiretinal membrane this might in fact be a slight improvement from her current count fingers in the right eye.  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

## 2021-01-06 NOTE — Progress Notes (Signed)
01/06/2021     CHIEF COMPLAINT Patient presents for  Chief Complaint  Patient presents with   Retina Follow Up    6 Mo F/U OU  Pt reports intermittent HA over OD. Pt denies changes to [place].      HISTORY OF PRESENT ILLNESS: Tina Hubbard is a 75 y.o. female who presents to the clinic today for:   HPI     Retina Follow Up   Patient presents with  Other.  In both eyes.  This started 6 months ago.  Severity is mild.  Duration of 6 months.  Since onset it is stable. Additional comments: 6 Mo F/U OU  Pt reports intermittent HA over OD. Pt denies changes to [place].        Comments   6 mos fu ou oct fp. Patient states vision is stable and unchanged since last visit. Denies any new floaters or FOL. Pt is not using any prescription eye drops.      Last edited by Laurin Coder on 01/06/2021  2:16 PM.      Referring physician: Dion Body, MD [facility] [place],  [place] [address]  HISTORICAL INFORMATION:   Selected notes from the MEDICAL RECORD NUMBER       CURRENT MEDICATIONS: Current Outpatient Medications (Ophthalmic Drugs)  Medication Sig   ketorolac (ACULAR) 0.5 % ophthalmic solution Place 1 drop into the left eye 3 (three) times daily. (Patient not taking: No sig reported)   No current facility-administered medications for this visit. (Ophthalmic Drugs)   Current Outpatient Medications (Other)  Medication Sig   amLODipine (NORVASC) 5 MG tablet Take 5 mg by mouth daily.    atorvastatin (LIPITOR) 20 MG tablet Take 20 mg by mouth daily.    b complex vitamins tablet Take 1 tablet by mouth daily.   busPIRone (BUSPAR) 10 MG tablet Take 10 mg by mouth daily.    busPIRone (BUSPAR) 10 MG tablet SMARTSIG:1 Tablet(s) By Mouth Twice Daily   celecoxib (CELEBREX) 200 MG capsule Take 200 mg by mouth 2 (two) times daily.   DULoxetine (CYMBALTA) 60 MG capsule Take 60 mg by mouth daily.   estradiol (ESTRACE) 1 MG tablet Take 1 mg by  mouth daily.   etanercept (ENBREL SURECLICK) 50 MG/ML injection Inject into the skin.   gabapentin (NEURONTIN) 300 MG capsule Take 300-600 mg by mouth See admin instructions. Take 600 mg by mouth in the morning, 300 mg midday and 600 mg at bedtime   methocarbamol (ROBAXIN) 750 MG tablet Take 1 tablet (750 mg total) by mouth 4 (four) times daily.   omeprazole (PRILOSEC) 20 MG capsule Take 20 mg by mouth daily.    oxyCODONE (OXY IR/ROXICODONE) 5 MG immediate release tablet Take 1 tablet (5 mg total) by mouth every 3 (three) hours as needed for moderate pain ((score 4 to 6)).   traZODone (DESYREL) 50 MG tablet Take 50 mg by mouth at bedtime.    valsartan-hydrochlorothiazide (DIOVAN-HCT) 320-12.5 MG tablet Take 1 tablet by mouth daily.    No current facility-administered medications for this visit. (Other)      REVIEW OF SYSTEMS:    ALLERGIES Allergies  Allergen Reactions   Hydrocodone Nausea And Vomiting   Oxycodone Other (See Comments)    Caused jitteriness and crying spells    PAST MEDICAL HISTORY Past Medical History:  Diagnosis Date   Adenoma of colon    Anxiety    Arthritis    Psoriatic  Coronary artery disease    Cystoid macular edema of right eye 07/16/2019   DDD (degenerative disc disease), lumbar    Depression    Detached retina    has a sown in oil bubble in eye since Jan 17th   GERD (gastroesophageal reflux disease)    Hyperlipidemia    Hypertension    Left leg pain    occasional numbness, R/T "slipped disc" above fusion   Osteoarthritis    Pre-diabetes    Right posterior capsular opacification 07/16/2019   Past Surgical History:  Procedure Laterality Date   ABDOMINAL HYSTERECTOMY     ANGIOPLASTY     BACK SURGERY  2015   SPINAL FUSION [facility]   BILATERAL CARPAL TUNNEL RELEASE Bilateral    BREAST BIOPSY Left yrs ago   EXCISIONAL X 2 - NEG   BREAST BIOPSY Right 02/10/2020   [place] Bx, vision clip, neg   BREAST BIOPSY Right 08/24/2020   Stereo  bx, x xlip, path pending   CARPAL TUNNEL RELEASE Left 02/27/2018   Procedure: CARPAL TUNNEL RELEASE;  Surgeon: Corky Mull, MD;  Location: [facility];  Service: Orthopedics;  Laterality: Left;   CATARACT EXTRACTION Bilateral    Dr. Talbert Forest   CHOLECYSTECTOMY     COLONOSCOPY     COLONOSCOPY WITH PROPOFOL N/A 01/10/2017   Procedure: COLONOSCOPY WITH PROPOFOL;  Surgeon: Manya Silvas, MD;  Location[facility] ENDOSCOPY;  Service: Endoscopy;  Laterality: N/A;   EYE SURGERY Bilateral    Cataract Extraction with IOL   FLEXIBLE SIGMOIDOSCOPY     JOINT REPLACEMENT Bilateral 2003   TOTAL KNEE REPLACEMENTS DR CALIFF [facility]   LUMBAR LAMINECTOMY     POSTERIOR LUMBAR FUSION 4 LEVEL N/A 09/04/2018   Procedure: L3-4 XLIF, L3-S1 PSF (MIS);  Surgeon: Meade Maw, MD;  Location: [facility] ORS;  Service: Neurosurgery;  Laterality: N/A;   RETINAL DETACHMENT SURGERY     SHOULDER ARTHROSCOPY WITH OPEN ROTATOR CUFF REPAIR Right 09/19/2016   Procedure: SHOULDER ARTHROSCOPY WITH MINI OPEN ROTATOR CUFF REPAIR;  Surgeon: Corky Mull, MD;  Location: [facility] ORS;  Service: Orthopedics;  Laterality: Right;  Biceps tenolysis   TENNIS ELBOW RELEASE/NIRSCHEL PROCEDURE Left 02/27/2018   Procedure: SUBCUTANEOUS ANTERIOR TRANSPOSITION OF [place] AT THE ELBOW;  Surgeon: Corky Mull, MD;  Location: [facility];  Service: Orthopedics;  Laterality: Left;   TRIGGER FINGER RELEASE     ULNAR NERVE TRANSPOSITION Left 02/21/2017   Procedure: ULNAR NERVE DECOMPRESSION WRIST GUYON'S CANAL;  Surgeon: Corky Mull, MD;  Location: [place];  Service: Orthopedics;  Laterality: Left;    FAMILY HISTORY Family History  Problem Relation Age of Onset   Uterine cancer Mother 43   Diabetes Mother    Diabetes Sister    Breast cancer Neg Hx     SOCIAL HISTORY Social History   Tobacco Use   Smoking status: Never   Smokeless tobacco: Never  Vaping Use   Vaping Use: Never used  Substance Use Topics    Alcohol use: Yes    Comment: glass of wine per month   Drug use: No         OPHTHALMIC EXAM:  Base Eye Exam     Visual Acuity (ETDRS)       Right Left   Dist cc CF at 5' 20/20   Dist ph cc 20/400   OD pt states on 20/400 : "I see a white square, no letters."  Tonometry (Tonopen, 2:19 PM)       Right Left   Pressure 8 14         Pupils       Dark Light Shape React APD   Right   Irregular fixed None   Left 5 4 Round Brisk None         Extraocular Movement       Right Left    Full Full         Neuro/Psych     Oriented x3: Yes   Mood/Affect: Normal         Dilation     Both eyes: 1.0% Mydriacyl, 2.5% Phenylephrine @ 2:19 PM           Slit Lamp and Fundus Exam     External Exam       Right Left   External Normal Normal         Slit Lamp Exam       Right Left   Lids/Lashes Normal    Conjunctiva/Sclera White and quiet    Cornea Clear    Anterior Chamber Deep and quiet    Iris Round and reactive    Lens Posterior chamber intraocular lens, 3+ Posterior capsular opacification Posterior chamber intraocular lens   Anterior Vitreous Clear vitrectomized          Fundus Exam       Right Left   Posterior Vitreous Vitrectomized, clear Posterior vitreous detachment   Disc Normal Normal   C/D Ratio 0.4 0.35   Macula Epiretinal membrane with moderate to severe topographic distortion,  Normal   Vessels Normal Normal   Periphery No retinal scarring, retinal detached peripherally well surrounded and demarcated by previous laser photocoagulation,,    the macula is a attached. No peripheral holes or tears left eye            IMAGING AND PROCEDURES  Imaging and Procedures for 01/06/21  OCT, Retina - OU - Both Eyes       Right Eye Quality was good. Scan locations included temporal. Central Foveal Thickness: 349. Progression has been stable. Findings include epiretinal membrane, abnormal foveal contour.   Left Eye Quality was  good. Scan locations included subfoveal. Central Foveal Thickness: 2891. Progression has been stable. Findings include normal foveal contour.   Notes OS with posterior vitreous detachment, normal macula  OD with epiretinal membrane and some topographic distortion to the macula, with overall intact outer retinal elements in the foveal perifoveal region.     Color Fundus Photography Optos - OU - Both Eyes       Right Eye Progression has been stable. Disc findings include increased cup to disc ratio, pallor. Macula : epiretinal membrane. Vessels : normal observations. Periphery : normal observations.   Left Eye Progression has been stable.   Notes Severe epiretinal membrane, overlies the fovea.  Stable OD from previous PVR   OS normal             ASSESSMENT/PLAN:  Right epiretinal membrane The nature of macular pucker (epiretinal membrane ERM) was discussed with the patient as well as threshold criteria for vitrectomy surgery. I explained that in rare cases another surgery is needed to actually remove a second wrinkle should it regrow.  Most often, the epiretinal membrane and underlying wrinkled internal limiting membrane are removed with the first surgery, to accomplish the goals.   If the operative eye is [place] (natural lens still present), cataract surgery is often recommended prior to  Vitrectomy. This will enable the retina surgeon to have the best view during surgery and the patient to obtain optimal results in the future. Treatment options were discussed.  I have recommended at home monitoring the near vision task in a monocular (1 eye at a time), with or without near vision glasses, to look for changes or declines in reading.  OD with moderate to severe topographic distortion from the epiretinal membrane over the fovea.  Outer retinal region in the fovea does appear intact and thus there is a moderate chance probably 50% chance of visual acuity improvement to the level  20/200 that she once had prior to the formation of the epiretinal membrane this might in fact be a slight improvement from her current count fingers in the right eye.  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  Posterior vitreous detachment, left eye No signs of holes or tears OS     ICD-10-CM   1. Right epiretinal membrane  H35.371 OCT, Retina - OU - Both Eyes    Color Fundus Photography Optos - OU - Both Eyes    2. Traction detachment of right retina  H33.41 OCT, Retina - OU - Both Eyes    Color Fundus Photography Optos - OU - Both Eyes    3. Posterior vitreous detachment, left eye  H43.812       1.  OS posterior vitreous detachment no holes or tears  2.  OD with moderate to severe topographic distortion in the macula from epiretinal membrane is residual prior repeat PVR and likely intrusion from adjacent chorioretinal scars which are encroaching on the posterior pole.  Outer retina is intact by OCT and there is moderate to severe folding and distortion in this region and likely patient would recover something out of the 20/200 visual acuity nonetheless peeling of an epiretinal membrane could trigger a small microscopic: Above and every detachment in this posterior segment but although not likely is a possibility  3.  Explained to the patient and family that there is no harm and to continue to monitor and observe and should this fold in this wrinkle worsen beyond what currently is present, we could revisit the possible need for surgical intervention  Ophthalmic Meds Ordered this visit:  No orders of the defined types were placed in this encounter.      Return in about 6 months (around 07/07/2021) for COLOR FP, DILATE OU, OCT.  There are no Patient Instructions on file for this visit.   Explained the diagnoses, plan, and follow up with the patient and they expressed understanding.   Patient expressed understanding of the importance of proper follow up care.   Clent Demark Johnny Gorter M.D. Diseases & Surgery of the Retina and Vitreous [facility] [place] 01/06/21     Abbreviations: M myopia (nearsighted); A astigmatism; H hyperopia (farsighted); P presbyopia; Mrx spectacle prescription;  CTL contact lenses; OD right eye; OS left eye; OU both eyes  XT exotropia; ET esotropia; PEK punctate epithelial keratitis; PEE punctate epithelial erosions; DES dry eye syndrome; MGD meibomian gland dysfunction; ATs artificial tears; PFAT's preservative free artificial tears; [place] nuclear sclerotic cataract; PSC posterior subcapsular cataract; ERM epi-retinal membrane; PVD posterior vitreous detachment; RD retinal detachment; DM diabetes mellitus; DR diabetic retinopathy; NPDR non-proliferative diabetic retinopathy; PDR proliferative diabetic retinopathy; CSME clinically significant macular edema; DME diabetic macular edema; dbh dot blot hemorrhages; CWS cotton wool  spot; POAG primary open angle glaucoma; C/D cup-to-disc ratio; HVF humphrey visual field; GVF goldmann visual field; OCT optical coherence tomography; IOP intraocular pressure; BRVO Branch retinal vein occlusion; CRVO central retinal vein occlusion; CRAO central retinal artery occlusion; BRAO branch retinal artery occlusion; RT retinal tear; SB scleral buckle; PPV pars plana vitrectomy; VH Vitreous hemorrhage; PRP panretinal laser photocoagulation; IVK intravitreal kenalog; VMT vitreomacular traction; MH Macular hole;  NVD neovascularization of the disc; NVE neovascularization elsewhere; AREDS age related eye disease study; ARMD age related macular degeneration; POAG primary open angle glaucoma; EBMD epithelial/anterior basement membrane dystrophy; ACIOL anterior chamber intraocular lens; IOL intraocular lens; PCIOL posterior chamber intraocular lens; Phaco/IOL phacoemulsification with intraocular lens placement; [place] photorefractive  keratectomy; LASIK laser assisted in situ keratomileusis; HTN hypertension; DM diabetes mellitus; COPD chronic obstructive pulmonary disease

## 2021-01-06 NOTE — Assessment & Plan Note (Signed)
No signs of holes or tears OS

## 2021-01-19 ENCOUNTER — Ambulatory Visit
Admission: RE | Admit: 2021-01-19 | Discharge: 2021-01-19 | Disposition: A | Discharge status: Home / Self Care | Payer: Medicare Other | Source: Ambulatory Visit | Attending: Family Medicine | Admitting: Family Medicine

## 2021-01-19 ENCOUNTER — Other Ambulatory Visit: Payer: Self-pay

## 2021-01-19 DIAGNOSIS — Z1231 Encounter for screening mammogram for malignant neoplasm of breast: Secondary | ICD-10-CM | POA: Insufficient documentation

## 2021-07-18 ENCOUNTER — Encounter (INDEPENDENT_AMBULATORY_CARE_PROVIDER_SITE_OTHER): Payer: Self-pay | Admitting: Ophthalmology

## 2021-07-18 ENCOUNTER — Ambulatory Visit (INDEPENDENT_AMBULATORY_CARE_PROVIDER_SITE_OTHER): Payer: Medicare Other | Admitting: Ophthalmology

## 2021-07-18 DIAGNOSIS — H35371 Puckering of macula, right eye: Secondary | ICD-10-CM | POA: Diagnosis not present

## 2021-07-18 DIAGNOSIS — H43812 Vitreous degeneration, left eye: Secondary | ICD-10-CM | POA: Diagnosis not present

## 2021-07-18 DIAGNOSIS — Z8669 Personal history of other diseases of the nervous system and sense organs: Secondary | ICD-10-CM

## 2021-07-18 DIAGNOSIS — H3341 Traction detachment of retina, right eye: Secondary | ICD-10-CM

## 2021-07-18 NOTE — Assessment & Plan Note (Signed)
OS physiologic no holes or tears retinal periphery ?

## 2021-07-18 NOTE — Assessment & Plan Note (Signed)
Resolved OD and active stable ?

## 2021-07-18 NOTE — Progress Notes (Signed)
? ? ?07/18/2021 ? ?  ? ?CHIEF COMPLAINT ?Patient presents for  ?Chief Complaint  ?Patient presents with  ? Retina Evaluation  ? ? ? ? ?HISTORY OF PRESENT ILLNESS: ?Tina Hubbard is a 76 y.o. female who presents to the clinic today for:  ? ?HPI   ? ? Retina Evaluation   ? ?      ? MD Performed: performed the HPI with the patient and updated documentation appropriately  ? ?  ?  ? ? 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 ? ?More recently The right eye is being monitored for a central epiretinal membrane over the macular region ? ?  ?  ?Last edited by Hurman Horn, MD on 07/18/2021  3:28 PM.  ?  ? ? ?Referring physician: ?Dion Body, MD ?[place] ?[facility] ?[place],  [place] [address] ? ?HISTORICAL INFORMATION:  ? ?Selected notes from the [place] ?  ?   ? ?CURRENT MEDICATIONS: ?Current Outpatient Medications (Ophthalmic Drugs)  ?Medication Sig  ? ketorolac (ACULAR) 0.5 % ophthalmic solution Place 1 drop into the left eye 3 (three) times daily. (Patient not taking: No sig reported)  ? ?No current facility-administered medications for this visit. (Ophthalmic Drugs)  ? ?Current Outpatient Medications (Other)  ?Medication Sig  ? amLODipine (NORVASC) 5 MG tablet Take 5 mg by mouth daily.   ? atorvastatin (LIPITOR) 20 MG tablet Take 20 mg by mouth daily.   ? b complex vitamins tablet Take 1 tablet by mouth daily.  ? busPIRone (BUSPAR) 10 MG tablet Take 10 mg by mouth daily.   ? busPIRone (BUSPAR) 10 MG tablet SMARTSIG:1 Tablet(s) By Mouth Twice Daily  ? celecoxib (CELEBREX) 200 MG capsule Take 200 mg by mouth 2 (two) times daily.  ? DULoxetine (CYMBALTA) 60 MG capsule Take 60 mg by mouth daily.  ? estradiol (ESTRACE) 1 MG tablet Take 1 mg by mouth daily.  ? etanercept (ENBREL SURECLICK) 50 MG/ML injection Inject into the skin.  ? gabapentin (NEURONTIN) 300 MG capsule Take 300-600 mg by mouth See admin instructions. Take 600 mg by  mouth in the morning, 300 mg midday and 600 mg at bedtime  ? methocarbamol (ROBAXIN) 750 MG tablet Take 1 tablet (750 mg total) by mouth 4 (four) times daily.  ? omeprazole (PRILOSEC) 20 MG capsule Take 20 mg by mouth daily.   ? oxyCODONE (OXY IR/ROXICODONE) 5 MG immediate release tablet Take 1 tablet (5 mg total) by mouth every 3 (three) hours as needed for moderate pain ((score 4 to 6)).  ? traZODone (DESYREL) 50 MG tablet Take 50 mg by mouth at bedtime.   ? valsartan-hydrochlorothiazide (DIOVAN-HCT) 320-12.5 MG tablet Take 1 tablet by mouth daily.   ? ?No current facility-administered medications for this visit. (Other)  ? ? ? ? ?REVIEW OF SYSTEMS: ?ROS   ?Negative for: Constitutional, Gastrointestinal, Neurological, Genitourinary, Musculoskeletal, HENT, Endocrine, Cardiovascular, Eyes, Respiratory, Psychiatric, Allergic/Imm, Heme/Lymph ?Last edited by Hurman Horn, MD on 07/18/2021  3:35 PM.  ?  ? ? ? ?ALLERGIES ?Allergies  ?Allergen Reactions  ? Hydrocodone Nausea And Vomiting  ? Oxycodone Other (See Comments)  ?  Caused jitteriness and crying spells  ? ? ?PAST MEDICAL HISTORY ?Past Medical History:  ?Diagnosis Date  ? Adenoma of colon   ? Anxiety   ? Arthritis   ? Psoriatic  ? Coronary artery disease   ? Cystoid macular edema of right eye 07/16/2019  ?  DDD (degenerative disc disease), lumbar   ? Depression   ? Detached retina   ? has a sown in oil bubble in eye since Jan 17th  ? GERD (gastroesophageal reflux disease)   ? Hyperlipidemia   ? Hypertension   ? Left leg pain   ? occasional numbness, R/T "slipped disc" above fusion  ? Osteoarthritis   ? Pre-diabetes   ? Right posterior capsular opacification 07/16/2019  ? ?Past Surgical History:  ?Procedure Laterality Date  ? ABDOMINAL HYSTERECTOMY    ? ANGIOPLASTY    ? BACK SURGERY  2015  ? [place]  ? BILATERAL CARPAL TUNNEL RELEASE Bilateral   ? BREAST BIOPSY Left yrs ago  ? EXCISIONAL X 2 - NEG  ? BREAST BIOPSY Right 02/10/2020  ? [place] Bx,  vision clip, neg  ? BREAST BIOPSY Right 08/24/2020  ? Stereo bx, x xlip, path pending  ? CARPAL TUNNEL RELEASE Left 02/27/2018  ? Procedure: CARPAL TUNNEL RELEASE;  Surgeon: Corky Mull, MD;  Location: [facility];  Service: Orthopedics;  Laterality: Left;  ? CATARACT EXTRACTION Bilateral   ? Dr. Talbert Forest  ? CHOLECYSTECTOMY    ? COLONOSCOPY    ? COLONOSCOPY WITH PROPOFOL N/A 01/10/2017  ? Procedure: COLONOSCOPY WITH PROPOFOL;  Surgeon: Manya Silvas, MD;  Location: [facility] ENDOSCOPY;  Service: Endoscopy;  Laterality: N/A;  ? EYE SURGERY Bilateral   ? Cataract Extraction with IOL  ? FLEXIBLE SIGMOIDOSCOPY    ? JOINT REPLACEMENT Bilateral 2003  ? TOTAL KNEE REPLACEMENTS DR CALIFF [facility]  ? LUMBAR LAMINECTOMY    ? POSTERIOR LUMBAR FUSION 4 LEVEL N/A 09/04/2018  ? Procedure: L3-4 XLIF, L3-S1 PSF (MIS);  Surgeon: Meade Maw, MD;  Location: [facility] ORS;  Service: Neurosurgery;  Laterality: N/A;  ? RETINAL DETACHMENT SURGERY    ? SHOULDER ARTHROSCOPY WITH OPEN ROTATOR CUFF REPAIR Right 09/19/2016  ? Procedure: SHOULDER ARTHROSCOPY WITH MINI OPEN ROTATOR CUFF REPAIR;  Surgeon: Corky Mull, MD;  Location: [facility] ORS;  Service: Orthopedics;  Laterality: Right;  Biceps tenolysis  ? TENNIS ELBOW RELEASE/NIRSCHEL PROCEDURE Left 02/27/2018  ? Procedure: SUBCUTANEOUS ANTERIOR TRANSPOSITION OF [place] AT [place];  Surgeon: Corky Mull, MD;  Location: [facility];  Service: Orthopedics;  Laterality: Left;  ? TRIGGER FINGER RELEASE    ? ULNAR NERVE TRANSPOSITION Left 02/21/2017  ? Procedure: ULNAR NERVE DECOMPRESSION WRIST GUYON'S CANAL;  Surgeon: Corky Mull, MD;  Location: [facility];  Service: Orthopedics;  Laterality: Left;  ? ? ?FAMILY HISTORY ?Family History  ?Problem Relation Age of Onset  ? Uterine cancer Mother 97  ? Diabetes Mother   ? Diabetes Sister   ? Breast cancer Neg Hx   ? ? ?SOCIAL HISTORY ?Social History  ? ?Tobacco Use  ? Smoking status: Never  ? Smokeless tobacco: Never   ?Vaping Use  ? Vaping Use: Never used  ?Substance Use Topics  ? Alcohol use: Yes  ?  Comment: glass of wine per month  ? Drug use: No  ? ?  ? ?  ? ?OPHTHALMIC EXAM: ? ?Base Eye Exam   ? ? Visual Acuity (ETDRS)   ? ?   Right Left  ? Dist [place] CF at 3'   ? Dist cc  20/25  ? Dist ph cc  20/25 +2  ? ?  ?  ? ? Tonometry (Tonopen, 3:01 PM)   ? ?   Right Left  ? Pressure 14 13  ? ?  ?  ? ?  Visual Fields   ? ?   Left Right  ? Restrictions  Partial inner superior temporal, inferior temporal, superior nasal, inferior nasal deficiencies  ? ?  ?  ? ? Extraocular Movement   ? ?   Right Left  ?  Full Full  ? ?  ?  ? ? Neuro/Psych   ? ? Oriented x3: Yes  ? Mood/Affect: Normal  ? ?  ?  ? ? Dilation   ? ? Both eyes: 1.0% Mydriacyl, 2.5% Phenylephrine @ 3:01 PM  ? ?  ?  ? ?  ? ?Slit Lamp and Fundus Exam   ? ? External Exam   ? ?   Right Left  ? External Normal Normal  ? ?  ?  ? ? Slit Lamp Exam   ? ?   Right Left  ? Lids/Lashes Normal Normal  ? Conjunctiva/Sclera White and quiet White and quiet  ? Cornea Clear Clear  ? Anterior Chamber Deep and quiet Deep and quiet  ? Iris Round and reactive Round and reactive  ? Lens Posterior chamber intraocular lens, 3+ Posterior capsular opacification Posterior chamber intraocular lens  ? Anterior Vitreous Clear vitrectomized Normal  ? ?  ?  ? ? Fundus Exam   ? ?   Right Left  ? Posterior Vitreous Vitrectomized, clear Posterior vitreous detachment  ? Disc Normal Normal  ? C/D Ratio 0.4 0.35  ? Macula Epiretinal membrane with severe topographic distortion,  Normal  ? Vessels Normal Normal  ? Periphery No retinal scarring, retinal detached peripherally well surrounded and demarcated by previous laser photocoagulation,,    the macula is a attached. No peripheral holes or tears left eye  ? ?  ?  ? ?  ? ? ?IMAGING AND PROCEDURES  ?Imaging and Procedures for 07/18/21 ? ?Color Fundus Photography Optos - OU - Both Eyes   ? ?   ?Right Eye ?Progression has been stable. Disc findings include increased cup to  disc ratio, pallor. Macula : epiretinal membrane. Vessels : normal observations. Periphery : normal observations.  ? ?Left Eye ?Progression has been stable.  ? ?Notes ?Severe epiretinal membrane, overlies

## 2021-07-18 NOTE — Assessment & Plan Note (Signed)
Epiretinal membrane again with severe topographic distortion of the macular region of the fovea with intact outer photoreceptor elements as noted by OCT ? ?Consideration for vitrectomy membrane peel is always an option if the outer photoreceptor elements are intact as well as the optic nerve appears to be functioning the temporal aspect of the nerve. ?

## 2021-10-27 ENCOUNTER — Encounter: Payer: Self-pay | Admitting: Emergency Medicine

## 2021-10-27 ENCOUNTER — Other Ambulatory Visit: Payer: Self-pay

## 2021-10-27 ENCOUNTER — Emergency Department
Admission: EM | Admit: 2021-10-27 | Discharge: 2021-10-27 | Disposition: A | Discharge status: Home / Self Care | Payer: Medicare Other | Attending: Emergency Medicine | Admitting: Emergency Medicine

## 2021-10-27 ENCOUNTER — Emergency Department: Payer: Medicare Other

## 2021-10-27 DIAGNOSIS — Y92480 Sidewalk as the place of occurrence of the external cause: Secondary | ICD-10-CM | POA: Diagnosis not present

## 2021-10-27 DIAGNOSIS — W19XXXA Unspecified fall, initial encounter: Secondary | ICD-10-CM

## 2021-10-27 DIAGNOSIS — W01198A Fall on same level from slipping, tripping and stumbling with subsequent striking against other object, initial encounter: Secondary | ICD-10-CM | POA: Diagnosis not present

## 2021-10-27 DIAGNOSIS — R519 Headache, unspecified: Secondary | ICD-10-CM | POA: Insufficient documentation

## 2021-10-27 DIAGNOSIS — S0083XA Contusion of other part of head, initial encounter: Secondary | ICD-10-CM

## 2021-10-27 DIAGNOSIS — S0990XA Unspecified injury of head, initial encounter: Secondary | ICD-10-CM

## 2021-10-27 NOTE — Discharge Instructions (Signed)
Your CT scans were normal.  Please return for any new, worsening, or change in symptoms or other concerns including headache, vomiting, neck pain, numbness, tingling, trouble walking or speaking, or any other concerns.  It was a pleasure caring for you today.

## 2021-10-27 NOTE — ED Provider Notes (Signed)
[facility] Provider Note    Event Date/Time   First MD Initiated Contact with Patient 10/27/21 [phone]     (approximate)   History   Fall   HPI  Tina Hubbard is a 76 y.o. female who presents today for evaluation after trip and fall.  Patient reports that her sandal got stuck while she was walking into the medical mall and she fell forward onto her hands and knees and struck her face on the concrete.  She denies loss of consciousness.  She reports that she was helped up by the valet, but has been able to walk unassisted.  She denies nausea, vomiting, vision changes, weakness, and paresthesias.  She reports that she has a mild headache.  She reports that she has pain to her mouth.  She denies any dental loosening.  She denies any preceding symptoms.  Thinks that tetanus is up-to-date.  Patient Active Problem List   Diagnosis Date Noted   History of retinal detachment 06/21/2020   Posterior vitreous detachment, left eye 12/11/2019   Right epiretinal membrane 07/16/2019   Traction detachment of right retina 07/16/2019   S/P lumbar fusion 09/04/2018          Physical Exam   Triage Vital Signs: ED Triage Vitals  Enc Vitals Group     BP 10/27/21 0824 (!) 141/82     Pulse Rate 10/27/21 0824 96     Resp 10/27/21 0824 16     Temp 10/27/21 0824 97.8 F (36.6 C)     Temp Source 10/27/21 0824 Oral     SpO2 10/27/21 0824 98 %     Weight 10/27/21 0825 163 lb (73.9 kg)     Height 10/27/21 0825 '5\' 3"'$  (1.6 m)     Head Circumference --      Peak Flow --      Pain Score 10/27/21 0824 3     Pain Loc --      Pain Edu? --      Excl. in [place]? --     Most recent vital signs: Vitals:   10/27/21 0824  BP: (!) 141/82  Pulse: 96  Resp: 16  Temp: 97.8 F (36.6 C)  SpO2: 98%    Physical Exam Vitals and nursing note reviewed.  Constitutional:      General: Awake and alert. No acute distress.    Appearance: Normal appearance. The patient is normal weight.   HENT:     Head: Normocephalic and atraumatic.     Mouth: Mucous membranes are moist.  No dental loosening.  No gingival bleeding or fluctuance.  No malocclusion.  No trismus No tenderness to nasal bridge or midface.  No periorbital tenderness.  No tenderness along the mandible.  No battle sign or raccoon eyes.  Small superficial abrasion noted to left side of chin Eyes:     General: PERRL. Normal EOMs        Right eye: No discharge.        Left eye: No discharge.     Conjunctiva/sclera: Conjunctivae normal.  Cardiovascular:     Rate and Rhythm: Normal rate and regular rhythm.     Pulses: Normal pulses.     Heart sounds: Normal heart sounds Pulmonary:     Effort: Pulmonary effort is normal. No respiratory distress.     Breath sounds: Normal breath sounds.  Abdominal:     Abdomen is soft. There is no abdominal tenderness. No rebound or guarding. No distention. Musculoskeletal:  General: No swelling. Normal range of motion.     Cervical back: Normal range of motion and neck supple.  No midline cervical spine tenderness.  Full range of motion of neck.  Negative Spurling test.  Negative Lhermitte sign.  Normal strength and sensation in bilateral upper extremities. Normal grip strength bilaterally.  Normal intrinsic muscle function of the hand bilaterally.  Normal radial pulses bilaterally. Skin:    General: Skin is warm and dry.     Capillary Refill: Capillary refill takes less than 2 seconds.     Findings: No rash.  Superficial abrasion to bilateral palms and right knee.  Full and normal range of motion of all 4 extremities without bony tenderness.  No snuffbox tenderness bilaterally.  No active bleeding, no foreign body noted. Neurological:     Mental Status: The patient is awake and alert.  Neurological: GCS 15 alert and oriented x3 Normal speech, no expressive or receptive aphasia or dysarthria Cranial nerves II through XII intact Normal visual fields 5 out of 5 strength in  all 4 extremities with intact sensation throughout No extremity drift Normal finger-to-nose testing, no limb or truncal ataxia     ED Results / Procedures / Treatments   Labs (all labs ordered are listed, but only abnormal results are displayed) Labs Reviewed - No data to display   EKG     RADIOLOGY I independently reviewed and interpreted imaging and agree with radiologists findings.     PROCEDURES:  Critical Care performed:   Procedures   MEDICATIONS ORDERED IN ED: Medications - No data to display   IMPRESSION / MDM / [place] / ED COURSE  I reviewed the triage vital signs and the nursing notes.   Differential diagnosis includes, but is not limited to, contusion, abrasion, intracranial hemorrhage, cervical spine fracture, facial fracture.  Patient is awake and alert, hemodynamically stable and afebrile.  She is neurologically and neurovascularly intact.  She has full normal range of motion of all 4 extremities.  No bony tenderness or joint pain.  CT head and neck obtained per [place] criteria.  CT face also obtained given that she struck her face.  CT scans are overall reassuring.  She denied any analgesia.  She is able to walk unassisted and feels comfortable with discharge.  We discussed strict return precautions and the importance of close outpatient follow-up.  Patient understands and agrees with plan.  Discharged in stable condition   Patient's presentation is most consistent with acute complicated illness / injury requiring diagnostic workup.     FINAL CLINICAL IMPRESSION(S) / ED DIAGNOSES   Final diagnoses:  None     Rx / DC Orders   ED Discharge Orders     None        Note:  This document was prepared using Dragon voice recognition software and may include unintentional dictation errors.   Emeline Gins 10/27/21 1057    Vanessa [place], MD 10/28/21 [phone]

## 2021-10-27 NOTE — ED Notes (Signed)
See triage note states she was walking in and tripped   fell face first hitting sidewalk  Swelling noted to lip  states she feels like her front teeth are loose  also has some abrasions to hands

## 2021-10-27 NOTE — ED Triage Notes (Signed)
Pt to ED from medical mall. Pt was walking in and tripped on the sidewalk. Pt hit her face on the side walk. Pt is having facial pain, minimal amount of bleeding noted on her bottom gum. Scrap on her chin and scrapes on bilateral hands. Pt is in NAD. Pt is not on blood thinners

## 2021-11-17 ENCOUNTER — Encounter (INDEPENDENT_AMBULATORY_CARE_PROVIDER_SITE_OTHER): Payer: Medicare Other | Admitting: Ophthalmology

## 2021-11-24 ENCOUNTER — Ambulatory Visit (INDEPENDENT_AMBULATORY_CARE_PROVIDER_SITE_OTHER): Payer: Medicare Other | Admitting: Ophthalmology

## 2021-11-24 ENCOUNTER — Encounter (INDEPENDENT_AMBULATORY_CARE_PROVIDER_SITE_OTHER): Payer: Self-pay | Admitting: Ophthalmology

## 2021-11-24 DIAGNOSIS — H35371 Puckering of macula, right eye: Secondary | ICD-10-CM | POA: Diagnosis not present

## 2021-11-24 DIAGNOSIS — H43812 Vitreous degeneration, left eye: Secondary | ICD-10-CM | POA: Diagnosis not present

## 2021-11-24 NOTE — Assessment & Plan Note (Signed)
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  Surgical intervention is a possibility but I would only likely recover to 20/200's vision at the best.  Nonetheless this would be a maximization of a potential vision in this eye.  The risk to the patient would be simply a chorioretinal scar in the adjacent area developing a micro hole and this could be lead to a retinal detachment again although not likely.  Laser photocoagulation at the time of removal of the epiretinal membrane could be undertaken if there is concern for this.

## 2021-11-24 NOTE — Progress Notes (Signed)
11/24/2021     CHIEF COMPLAINT Patient presents for  Chief Complaint  Patient presents with   Retina Evaluation      HISTORY OF PRESENT ILLNESS: Tina Hubbard is a 76 y.o. 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  Count fingers vision is currently present but most recently within the last 2 years best acuity in the right eye was 20/200's.  This has been declining on the basis of epiretinal membrane follow-up epiretinal membrane OD today  4 mths dilate od oct Pt states her vision has been stable Pt denies any new floaters or FOL Pt states when she wakes up in the morning and she bends over she will see a black circle come in her vision of her right eye Pt states its a black circle with a clear inside only when she looks down.      Last edited by Hurman Horn, MD on 11/24/2021  1:46 PM.      Referring physician: Dion Body, MD [facility] [place],  [place] [address]  HISTORICAL INFORMATION:   Selected notes from the MEDICAL RECORD NUMBER       CURRENT MEDICATIONS: Current Outpatient Medications (Ophthalmic Drugs)  Medication Sig   ketorolac (ACULAR) 0.5 % ophthalmic solution Place 1 drop into the left eye 3 (three) times daily. (Patient not taking: No sig reported)   No current facility-administered medications for this visit. (Ophthalmic Drugs)   Current Outpatient Medications (Other)  Medication Sig   amLODipine (NORVASC) 5 MG tablet Take 5 mg by mouth daily.    atorvastatin (LIPITOR) 20 MG tablet Take 20 mg by mouth daily.    b complex vitamins tablet Take 1 tablet by mouth daily.   busPIRone (BUSPAR) 10 MG tablet Take 10 mg by mouth daily.    busPIRone (BUSPAR) 10 MG tablet SMARTSIG:1 Tablet(s) By Mouth Twice Daily   celecoxib (CELEBREX) 200 MG capsule Take 200 mg by  mouth 2 (two) times daily.   DULoxetine (CYMBALTA) 60 MG capsule Take 60 mg by mouth daily.   estradiol (ESTRACE) 1 MG tablet Take 1 mg by mouth daily.   etanercept (ENBREL SURECLICK) 50 MG/ML injection Inject into the skin.   gabapentin (NEURONTIN) 300 MG capsule Take 300-600 mg by mouth See admin instructions. Take 600 mg by mouth in the morning, 300 mg midday and 600 mg at bedtime   omeprazole (PRILOSEC) 20 MG capsule Take 20 mg by mouth daily.    traZODone (DESYREL) 50 MG tablet Take 50 mg by mouth at bedtime.    valsartan-hydrochlorothiazide (DIOVAN-HCT) 320-12.5 MG tablet Take 1 tablet by mouth daily.    No current facility-administered medications for this visit. (Other)      REVIEW OF SYSTEMS: ROS   Negative for: Constitutional, Gastrointestinal, Neurological, Skin, Genitourinary, Musculoskeletal, HENT, Endocrine, Cardiovascular, Eyes, Respiratory, Psychiatric, Allergic/Imm, Heme/Lymph Last edited by Morene Rankins, CMA on 11/24/2021  1:26 PM.       ALLERGIES Allergies  Allergen Reactions   Hydrocodone Nausea And Vomiting   Oxycodone Other (See Comments)    Caused jitteriness and crying spells    PAST MEDICAL HISTORY Past Medical History:  Diagnosis Date   Adenoma of colon    Anxiety  Arthritis    Psoriatic   Coronary artery disease    Cystoid macular edema of right eye 07/16/2019   DDD (degenerative disc disease), lumbar    Depression    Detached retina    has a sown in oil bubble in eye since Jan 17th   GERD (gastroesophageal reflux disease)    Hyperlipidemia    Hypertension    Left leg pain    occasional numbness, R/T "slipped disc" above fusion   Osteoarthritis    Pre-diabetes    Right posterior capsular opacification 07/16/2019   Past Surgical History:  Procedure Laterality Date   ABDOMINAL HYSTERECTOMY     ANGIOPLASTY     BACK SURGERY  2015   SPINAL FUSION [facility]   BILATERAL CARPAL TUNNEL RELEASE Bilateral    BREAST BIOPSY Left  yrs ago   EXCISIONAL X 2 - NEG   BREAST BIOPSY Right 02/10/2020   [place] Bx, vision clip, neg   BREAST BIOPSY Right 08/24/2020   Stereo bx, x xlip, path pending   CARPAL TUNNEL RELEASE Left 02/27/2018   Procedure: CARPAL TUNNEL RELEASE;  Surgeon: Corky Mull, MD;  Location: [place];  Service: Orthopedics;  Laterality: Left;   CATARACT EXTRACTION Bilateral    Dr. Talbert Forest   CHOLECYSTECTOMY     COLONOSCOPY     COLONOSCOPY WITH PROPOFOL N/A 01/10/2017   Procedure: COLONOSCOPY WITH PROPOFOL;  Surgeon: Manya Silvas, MD;  Location[facility] ENDOSCOPY;  Service: Endoscopy;  Laterality: N/A;   EYE SURGERY Bilateral    Cataract Extraction with IOL   FLEXIBLE SIGMOIDOSCOPY     JOINT REPLACEMENT Bilateral 2003   TOTAL KNEE REPLACEMENTS DR CALIFF [facility]   LUMBAR LAMINECTOMY     POSTERIOR LUMBAR FUSION 4 LEVEL N/A 09/04/2018   Procedure: L3-4 XLIF, L3-S1 PSF (MIS);  Surgeon: Meade Maw, MD;  Location: [facility] ORS;  Service: Neurosurgery;  Laterality: N/A;   RETINAL DETACHMENT SURGERY     SHOULDER ARTHROSCOPY WITH OPEN ROTATOR CUFF REPAIR Right 09/19/2016   Procedure: SHOULDER ARTHROSCOPY WITH MINI OPEN ROTATOR CUFF REPAIR;  Surgeon: Corky Mull, MD;  Location: [facility] ORS;  Service: Orthopedics;  Laterality: Right;  Biceps tenolysis   TENNIS ELBOW RELEASE/NIRSCHEL PROCEDURE Left 02/27/2018   Procedure: SUBCUTANEOUS ANTERIOR TRANSPOSITION OF [place] AT THE ELBOW;  Surgeon: Corky Mull, MD;  Location: [place];  Service: Orthopedics;  Laterality: Left;   TRIGGER FINGER RELEASE     ULNAR NERVE TRANSPOSITION Left 02/21/2017   Procedure: ULNAR NERVE DECOMPRESSION WRIST GUYON'S CANAL;  Surgeon: Corky Mull, MD;  Location: [place];  Service: Orthopedics;  Laterality: Left;    FAMILY HISTORY Family History  Problem Relation Age of Onset   Uterine cancer Mother 12   Diabetes Mother    Diabetes Sister    Breast cancer Neg Hx     SOCIAL HISTORY Social  History   Tobacco Use   Smoking status: Never   Smokeless tobacco: Never  Vaping Use   Vaping Use: Never used  Substance Use Topics   Alcohol use: Yes    Comment: glass of wine per month   Drug use: No         OPHTHALMIC EXAM:  Base Eye Exam     Visual Acuity (ETDRS)       Right Left   Dist cc CF at 3' 20/20    Correction: Glasses         Tonometry (Tonopen, 1:29 PM)  Right Left   Pressure 10 10         Pupils       Pupils APD   Right PERRL None   Left PERRL None         Visual Fields       Left Right    Full    Restrictions  Total superior temporal, inferior temporal, superior nasal, inferior nasal deficiencies         Neuro/Psych     Oriented x3: Yes   Mood/Affect: Normal         Dilation     Right eye: 2.5% Phenylephrine, 1.0% Mydriacyl @ 1:27 PM           Slit Lamp and Fundus Exam     External Exam       Right Left   External Normal Normal         Slit Lamp Exam       Right Left   Lids/Lashes Normal Normal   Conjunctiva/Sclera White and quiet White and quiet   Cornea old pigment, K spindle Clear   Anterior Chamber Deep and quiet Deep and quiet   Iris Round and reactive Round and reactive   Lens Posterior chamber intraocular lens, 3+ Posterior capsular opacification Posterior chamber intraocular lens   Anterior Vitreous Clear vitrectomized Normal         Fundus Exam       Right Left   Posterior Vitreous Vitrectomized, clear Posterior vitreous detachment   Disc Normal Normal   C/D Ratio 0.4 0.35   Macula Epiretinal membrane with severe topographic distortion, with white opaque scar over the fovea accounting for drop in vision Normal   Vessels Normal Normal   Periphery No retinal scarring, retinal detached peripherally well surrounded and demarcated by previous laser photocoagulation,,    the macula is a attached. No peripheral holes or tears left eye            IMAGING AND PROCEDURES  Imaging and  Procedures for 11/24/21  OCT, Retina - OU - Both Eyes       Right Eye Quality was good. Scan locations included temporal. Central Foveal Thickness: 342. Progression has been stable. Findings include abnormal foveal contour, epiretinal membrane.   Left Eye Quality was good. Scan locations included subfoveal. Central Foveal Thickness: 284. Progression has been stable. Findings include normal foveal contour.   Notes OS with posterior vitreous detachment, normal macula  OD with epiretinal membrane and some topographic distortion to the macula, with overall intact outer retinal elements in the foveal perifoveal region.             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  Surgical intervention is a possibility but I would only likely recover to 20/200's vision at the best.  Nonetheless this would be a maximization of a potential vision in this eye.  The risk to the patient would be simply a chorioretinal scar in the adjacent area developing a micro hole and this could be lead to a retinal detachment again although not likely.  Laser photocoagulation at the time of removal of the epiretinal membrane could be undertaken if there is concern for this.  Posterior vitreous detachment, left eye Physiologic OS no holes or tears     ICD-10-CM   1. Right  epiretinal membrane  H35.371 OCT, Retina - OU - Both Eyes    2. Posterior vitreous detachment, left eye  H43.812       1.  OD epiretinal membrane accounting for acuity.  Option for vitrectomy membrane peel or observed.  We will continue observe for now.  2.  OS no new symptoms.  Doing well  3.  Ophthalmic Meds Ordered this visit:  No orders of the defined types were placed in this  encounter.      Return in about 6 months (around 05/25/2022) for DILATE OU, COLOR FP, OCT.  There are no Patient Instructions on file for this visit.   Explained the diagnoses, plan, and follow up with the patient and they expressed understanding.  Patient expressed understanding of the importance of proper follow up care.   Clent Demark Satoshi Kalas M.D. Diseases & Surgery of the Retina and Vitreous Retina & Diabetic [place] 11/24/21     Abbreviations: M myopia (nearsighted); A astigmatism; H hyperopia (farsighted); P presbyopia; Mrx spectacle prescription;  CTL contact lenses; OD right eye; OS left eye; OU both eyes  XT exotropia; ET esotropia; PEK punctate epithelial keratitis; PEE punctate epithelial erosions; DES dry eye syndrome; MGD meibomian gland dysfunction; ATs artificial tears; PFAT's preservative free artificial tears; [place] nuclear sclerotic cataract; PSC posterior subcapsular cataract; ERM epi-retinal membrane; PVD posterior vitreous detachment; RD retinal detachment; DM diabetes mellitus; DR diabetic retinopathy; NPDR non-proliferative diabetic retinopathy; PDR proliferative diabetic retinopathy; CSME clinically significant macular edema; DME diabetic macular edema; dbh dot blot hemorrhages; CWS cotton wool spot; POAG primary open angle glaucoma; C/D cup-to-disc ratio; HVF humphrey visual field; GVF goldmann visual field; OCT optical coherence tomography; IOP intraocular pressure; BRVO Branch retinal vein occlusion; CRVO central retinal vein occlusion; CRAO central retinal artery occlusion; BRAO branch retinal artery occlusion; RT retinal tear; SB scleral buckle; PPV pars plana vitrectomy; VH Vitreous hemorrhage; PRP panretinal laser photocoagulation; IVK intravitreal kenalog; VMT vitreomacular traction; MH Macular hole;  NVD neovascularization of the disc; NVE neovascularization elsewhere; AREDS age related eye disease study; ARMD age related macular degeneration; POAG primary open angle  glaucoma; EBMD epithelial/anterior basement membrane dystrophy; ACIOL anterior chamber intraocular lens; IOL intraocular lens; PCIOL posterior chamber intraocular lens; Phaco/IOL phacoemulsification with intraocular lens placement; [place] photorefractive keratectomy; LASIK laser assisted in situ keratomileusis; HTN hypertension; DM diabetes mellitus; COPD chronic obstructive pulmonary disease

## 2021-11-24 NOTE — Assessment & Plan Note (Signed)
Physiologic OS no holes or tears 

## 2022-01-03 ENCOUNTER — Other Ambulatory Visit: Payer: Self-pay | Admitting: Family Medicine

## 2022-01-03 DIAGNOSIS — Z1231 Encounter for screening mammogram for malignant neoplasm of breast: Secondary | ICD-10-CM

## 2022-01-12 ENCOUNTER — Encounter (INDEPENDENT_AMBULATORY_CARE_PROVIDER_SITE_OTHER): Payer: Medicare Other | Admitting: Ophthalmology

## 2022-02-01 ENCOUNTER — Ambulatory Visit
Admission: RE | Admit: 2022-02-01 | Discharge: 2022-02-01 | Disposition: A | Discharge status: Home / Self Care | Payer: Medicare Other | Source: Ambulatory Visit | Attending: Family Medicine | Admitting: Family Medicine

## 2022-02-01 DIAGNOSIS — Z1231 Encounter for screening mammogram for malignant neoplasm of breast: Secondary | ICD-10-CM | POA: Diagnosis present

## 2022-03-18 ENCOUNTER — Emergency Department
Admission: EM | Admit: 2022-03-18 | Discharge: 2022-03-18 | Disposition: A | Discharge status: Home / Self Care | Payer: Medicare Other | Attending: Emergency Medicine | Admitting: Emergency Medicine

## 2022-03-18 ENCOUNTER — Other Ambulatory Visit: Payer: Self-pay

## 2022-03-18 ENCOUNTER — Emergency Department: Payer: Medicare Other

## 2022-03-18 DIAGNOSIS — R112 Nausea with vomiting, unspecified: Secondary | ICD-10-CM

## 2022-03-18 DIAGNOSIS — R1900 Intra-abdominal and pelvic swelling, mass and lump, unspecified site: Secondary | ICD-10-CM

## 2022-03-18 DIAGNOSIS — R1030 Lower abdominal pain, unspecified: Secondary | ICD-10-CM

## 2022-03-18 DIAGNOSIS — R1909 Other intra-abdominal and pelvic swelling, mass and lump: Secondary | ICD-10-CM | POA: Diagnosis not present

## 2022-03-18 DIAGNOSIS — Z20822 Contact with and (suspected) exposure to covid-19: Secondary | ICD-10-CM | POA: Diagnosis not present

## 2022-03-18 DIAGNOSIS — R197 Diarrhea, unspecified: Secondary | ICD-10-CM | POA: Diagnosis not present

## 2022-03-18 LAB — CBC
HCT: 42.6 % (ref 36.0–46.0)
Hemoglobin: 14.6 g/dL (ref 12.0–15.0)
MCH: 30.7 pg (ref 26.0–34.0)
MCHC: 34.3 g/dL (ref 30.0–36.0)
MCV: 89.7 fL (ref 80.0–100.0)
Platelets: 337 10*3/uL (ref 150–400)
RBC: 4.75 MIL/uL (ref 3.87–5.11)
RDW: 12.1 % (ref 11.5–15.5)
WBC: 5.9 10*3/uL (ref 4.0–10.5)
nRBC: 0 % (ref 0.0–0.2)

## 2022-03-18 LAB — COMPREHENSIVE METABOLIC PANEL
ALT: 24 U/L (ref 0–44)
AST: 46 U/L — ABNORMAL HIGH (ref 15–41)
Albumin: 4.4 g/dL (ref 3.5–5.0)
Alkaline Phosphatase: 99 U/L (ref 38–126)
Anion gap: 14 (ref 5–15)
BUN: 27 mg/dL — ABNORMAL HIGH (ref 8–23)
CO2: 23 mmol/L (ref 22–32)
Calcium: 9.2 mg/dL (ref 8.9–10.3)
Chloride: 103 mmol/L (ref 98–111)
Creatinine, Ser: 1.1 mg/dL — ABNORMAL HIGH (ref 0.44–1.00)
GFR, Estimated: 52 mL/min — ABNORMAL LOW (ref >60)
Glucose, Bld: 182 mg/dL — ABNORMAL HIGH (ref 70–99)
Potassium: 5.2 mmol/L — ABNORMAL HIGH (ref 3.5–5.1)
Sodium: 140 mmol/L (ref 135–145)
Total Bilirubin: 3.3 mg/dL — ABNORMAL HIGH (ref 0.3–1.2)
Total Protein: 7.7 g/dL (ref 6.5–8.1)

## 2022-03-18 LAB — URINALYSIS, ROUTINE W REFLEX MICROSCOPIC
Bilirubin Urine: NEGATIVE
Glucose, UA: NEGATIVE mg/dL
Hgb urine dipstick: NEGATIVE
Ketones, ur: 5 mg/dL — AB
Leukocytes,Ua: NEGATIVE
Nitrite: NEGATIVE
Protein, ur: NEGATIVE mg/dL
Specific Gravity, Urine: >1.046 — ABNORMAL HIGH (ref 1.005–1.030)
pH: 6 (ref 5.0–8.0)

## 2022-03-18 LAB — RESP PANEL BY RT-PCR (RSV, FLU A&B, COVID)  RVPGX2
Influenza A by PCR: NEGATIVE
Influenza B by PCR: NEGATIVE
Resp Syncytial Virus by PCR: NEGATIVE
SARS Coronavirus 2 by RT PCR: NEGATIVE

## 2022-03-18 LAB — LIPASE, BLOOD: Lipase: 30 U/L (ref 11–51)

## 2022-03-18 MED ORDER — ONDANSETRON HCL 4 MG/2ML IJ SOLN
4.0000 mg | Freq: Once | INTRAMUSCULAR | Status: AC
  Administered 2022-03-18: 4 mg via INTRAVENOUS
  Filled 2022-03-18: qty 2

## 2022-03-18 MED ORDER — SODIUM CHLORIDE 0.9 % IV BOLUS
1000.0000 mL | Freq: Once | INTRAVENOUS | Status: AC
  Administered 2022-03-18: 1000 mL via INTRAVENOUS

## 2022-03-18 MED ORDER — IOHEXOL 300 MG/ML  SOLN
100.0000 mL | Freq: Once | INTRAMUSCULAR | Status: AC | PRN
  Administered 2022-03-18: 100 mL via INTRAVENOUS

## 2022-03-18 MED ORDER — ONDANSETRON 8 MG PO TBDP: 8.0000 mg | ORAL_TABLET | Freq: Three times a day (TID) | ORAL | 0 refills | Status: DC | PRN

## 2022-03-18 NOTE — ED Triage Notes (Signed)
Pt to ED via POV from home. Pt reports abdominal pain and N/V/D since last night. Pt denies sick contacts.

## 2022-03-18 NOTE — ED Provider Notes (Signed)
[facility] Provider Note   Event Date/Time   First MD Initiated Contact with Patient 03/18/22 1231     (approximate) History  Abdominal Pain  HPI Tina Hubbard is a 76 y.o. female with no stated past medical history presents from home with 24 hours of nausea/vomiting/diarrhea.  Patient states that she has been unable to keep down any food since last night when she had 1 episode of nausea/vomiting.  Patient states that she also woke up today with multiple episodes of diarrhea and lower quadrant abdominal pain that relieves after each bowel movement.  Patient denies any recent travel or sick contacts ROS: Patient currently denies any vision changes, tinnitus, difficulty speaking, facial droop, sore throat, chest pain, shortness of breath, dysuria, or weakness/numbness/paresthesias in any extremity   Physical Exam  Triage Vital Signs: ED Triage Vitals  Enc Vitals Group     BP 03/18/22 1026 122/72     Pulse Rate 03/18/22 1023 (!) 111     Resp 03/18/22 1023 18     Temp 03/18/22 1026 97.6 F (36.4 C)     Temp Source 03/18/22 1026 Oral     SpO2 03/18/22 1023 100 %     Weight --      Height --      Head Circumference --      Peak Flow --      Pain Score 03/18/22 1023 5     Pain Loc --      Pain Edu? --      Excl. in [place]? --    Most recent vital signs: Vitals:   03/18/22 1026 03/18/22 1221  BP: 122/72 (!) 155/63  Pulse:  (!) 115  Resp:  20  Temp: 97.6 F (36.4 C)   SpO2:  99%   General: Awake, oriented x4. CV:  Good peripheral perfusion.  Resp:  Normal effort.  Abd:  No distention.  Other:  Elderly overweight Caucasian female laying in bed in no acute distress ED Results / Procedures / Treatments  Labs (all labs ordered are listed, but only abnormal results are displayed) Labs Reviewed  COMPREHENSIVE METABOLIC PANEL - Abnormal; Notable for the following components:      Result Value   Potassium 5.2 (*)    Glucose, Bld 182 (*)    BUN 27 (*)     Creatinine, Ser 1.10 (*)    AST 46 (*)    Total Bilirubin 3.3 (*)    GFR, Estimated 52 (*)    All other components within normal limits  URINALYSIS, ROUTINE W REFLEX MICROSCOPIC - Abnormal; Notable for the following components:   Color, Urine YELLOW (*)    APPearance CLEAR (*)    Specific Gravity, Urine >1.046 (*)    Ketones, ur 5 (*)    All other components within normal limits  RESP PANEL BY RT-PCR (RSV, FLU A&B, COVID)  [id number]  LIPASE, BLOOD  CBC  RADIOLOGY ED MD interpretation: CT of the abdomen and pelvis with IV contrast shows multilocular cystic right adnexal mass measuring up to 9.5 cm concerning for cystic ovarian neoplasm -Agree with radiology assessment Official radiology report(s): CT Abdomen Pelvis W Contrast  Result Date: 03/18/2022 CLINICAL DATA:  Abdominal pain EXAM: CT ABDOMEN AND PELVIS WITH CONTRAST TECHNIQUE: Multidetector CT imaging of the abdomen and pelvis was performed using the standard protocol following bolus administration of intravenous contrast. RADIATION DOSE REDUCTION: This exam was performed according to the departmental dose-optimization program which includes automated exposure control, adjustment  of the mA and/or kV according to patient size and/or use of iterative reconstruction technique. CONTRAST:  118m OMNIPAQUE IOHEXOL 300 MG/ML  SOLN COMPARISON:  06/06/2011 FINDINGS: Lower chest: No acute pleural or parenchymal lung disease. Hepatobiliary: Stable 8 mm hemangioma right lobe liver. Diffuse hepatic steatosis. No biliary duct dilation. Cholecystectomy. Pancreas: Unremarkable. No pancreatic ductal dilatation or surrounding inflammatory changes. Spleen: Normal in size without focal abnormality. Adrenals/Urinary Tract: Adrenal glands are unremarkable. Kidneys are normal, without renal calculi, focal lesion, or hydronephrosis. Bladder is unremarkable. Stomach/Bowel: No bowel obstruction or ileus. Normal appendix right lower quadrant. No bowel wall thickening or  inflammatory change. Vascular/Lymphatic: Aortic atherosclerosis. No enlarged abdominal or pelvic lymph nodes. Reproductive: Previous hysterectomy. Multilocular cystic mass is seen within the right adnexa, measuring 9.5 x 7.0 x 6.9 cm. There are several thin septation seen internally within this mass. No mural thickening or nodularity is identified. Left ovary is unremarkable. Other: No free fluid or free intraperitoneal gas. No abdominal wall hernia. Musculoskeletal: No acute or destructive bony lesions. Postsurgical changes are seen from discectomy spanning L3-4 through L5-S1. Posterior fusion hardware is seen within the L3, L4, L5, and S1 vertebral bodies. There is progressive spondylosis at the L2-3 level since prior exam. Reconstructed images demonstrate no additional findings. IMPRESSION: 1. Multilocular cystic right adnexal mass measuring up to 9.5 cm, concerning for cystic ovarian neoplasm in a patient of this age. Gynecologic consultation and follow-up outpatient MRI may be useful for further characterization. 2. Hepatic steatosis. 3. Postsurgical changes lower lumbar spine, with progressive spondylosis at the L2-3 level as above. No acute bony abnormality. 4.  Aortic Atherosclerosis (ICD10-I70.0). Electronically Signed   By: MRanda NgoM.D.   On: 03/18/2022 14:29   PROCEDURES: Critical Care performed: No .1-3 Lead EKG Interpretation  Performed by: BNaaman Plummer MD Authorized by: BNaaman Plummer MD     Interpretation: normal     ECG rate:  71   ECG rate assessment: normal     Rhythm: sinus rhythm     Ectopy: none     Conduction: normal    MEDICATIONS ORDERED IN ED: Medications  sodium chloride 0.9 % bolus 1,000 mL (0 mLs Intravenous Stopped 03/18/22 1430)  ondansetron (ZOFRAN) injection 4 mg (4 mg Intravenous Given 03/18/22 1329)  iohexol (OMNIPAQUE) 300 MG/ML solution 100 mL (100 mLs Intravenous Contrast Given 03/18/22 1342)   IMPRESSION / MDM / ASSESSMENT AND PLAN / ED COURSE   I reviewed the triage vital signs and the nursing notes.                             The patient is on the cardiac monitor to evaluate for evidence of arrhythmia and/or significant heart rate changes. Patient's presentation is most consistent with acute presentation with potential threat to life or bodily function. Patient presents for acute nausea/vomiting The cause of the patients symptoms is not clear, but the patient is overall well appearing and is suspected to have a transient course of illness.  Given History and Exam there does not appear to be an emergent cause of the symptoms such as small bowel obstruction, coronary syndrome, bowel ischemia, DKA, pancreatitis, appendicitis, other acute abdomen or other emergent problem.  Reassessment: After treatment, the patient is feeling much better, tolerating PO fluids, and shows no signs of dehydration.  Patient informed about pelvic mass and concerns for possible malignancy and therefore recommended follow-up with OB/GYN.  Patient expressed understanding Disposition:  Discharge home with prompt primary care physician follow up in the next 48 hours. Strict return precautions discussed.   FINAL CLINICAL IMPRESSION(S) / ED DIAGNOSES   Final diagnoses:  Lower abdominal pain  Nausea vomiting and diarrhea  Pelvic mass   Rx / DC Orders   ED Discharge Orders          Ordered    ondansetron (ZOFRAN-ODT) 8 MG disintegrating tablet  Every 8 hours PRN        03/18/22 1619           Note:  This document was prepared using Dragon voice recognition software and may include unintentional dictation errors.   Naaman Plummer, MD 03/18/22 [phone]

## 2022-04-17 ENCOUNTER — Telehealth: Payer: Self-pay

## 2022-04-17 NOTE — Telephone Encounter (Signed)
Dr. Leafy Ro has requested potential assist in surgery with this patient with adnexal mass. Called to arrange appointment with Dr. Theora Gianotti for consenting. Left voicemail to return call.

## 2022-04-26 ENCOUNTER — Inpatient Hospital Stay: Payer: Medicare Other | Attending: Obstetrics and Gynecology | Admitting: Obstetrics and Gynecology

## 2022-04-26 VITALS — BP 136/80 | HR 76 | Temp 97.8°F | Resp 20 | Wt 163.7 lb

## 2022-04-26 DIAGNOSIS — Z79890 Hormone replacement therapy (postmenopausal): Secondary | ICD-10-CM | POA: Diagnosis not present

## 2022-04-26 DIAGNOSIS — Z791 Long term (current) use of non-steroidal anti-inflammatories (NSAID): Secondary | ICD-10-CM | POA: Insufficient documentation

## 2022-04-26 DIAGNOSIS — I2510 Atherosclerotic heart disease of native coronary artery without angina pectoris: Secondary | ICD-10-CM | POA: Insufficient documentation

## 2022-04-26 DIAGNOSIS — Z79899 Other long term (current) drug therapy: Secondary | ICD-10-CM | POA: Insufficient documentation

## 2022-04-26 DIAGNOSIS — E785 Hyperlipidemia, unspecified: Secondary | ICD-10-CM | POA: Diagnosis not present

## 2022-04-26 DIAGNOSIS — D3911 Neoplasm of uncertain behavior of right ovary: Secondary | ICD-10-CM | POA: Diagnosis present

## 2022-04-26 DIAGNOSIS — Z7189 Other specified counseling: Secondary | ICD-10-CM | POA: Diagnosis not present

## 2022-04-26 DIAGNOSIS — I700 Atherosclerosis of aorta: Secondary | ICD-10-CM | POA: Diagnosis not present

## 2022-04-26 DIAGNOSIS — Z90710 Acquired absence of both cervix and uterus: Secondary | ICD-10-CM | POA: Diagnosis not present

## 2022-04-26 DIAGNOSIS — L4050 Arthropathic psoriasis, unspecified: Secondary | ICD-10-CM | POA: Insufficient documentation

## 2022-04-26 DIAGNOSIS — K219 Gastro-esophageal reflux disease without esophagitis: Secondary | ICD-10-CM | POA: Diagnosis not present

## 2022-04-26 DIAGNOSIS — Z86010 Personal history of colonic polyps: Secondary | ICD-10-CM | POA: Diagnosis not present

## 2022-04-26 DIAGNOSIS — F419 Anxiety disorder, unspecified: Secondary | ICD-10-CM | POA: Insufficient documentation

## 2022-04-26 DIAGNOSIS — I10 Essential (primary) hypertension: Secondary | ICD-10-CM | POA: Insufficient documentation

## 2022-04-26 DIAGNOSIS — Z8049 Family history of malignant neoplasm of other genital organs: Secondary | ICD-10-CM | POA: Insufficient documentation

## 2022-04-26 DIAGNOSIS — N9489 Other specified conditions associated with female genital organs and menstrual cycle: Secondary | ICD-10-CM

## 2022-04-26 DIAGNOSIS — F32A Depression, unspecified: Secondary | ICD-10-CM | POA: Diagnosis not present

## 2022-04-26 DIAGNOSIS — M47816 Spondylosis without myelopathy or radiculopathy, lumbar region: Secondary | ICD-10-CM | POA: Insufficient documentation

## 2022-04-26 NOTE — Progress Notes (Signed)
Gynecologic Oncology Consult Visit   Referring Provider: Dr. Benjaman Kindler   Chief Concern: Adnexal mass  Subjective:  Tina Hubbard is a 77 y.o. G2P2 female s/p Hysterectomy (benign disease) who is seen in consultation from Dr. Leafy Ro  for incidental adnexal mass.   She presented with abdominal pain and a CT scan was obtained.   02/2022 CT A/P: Reproductive: Previous hysterectomy. Multilocular cystic mass is seen within the right adnexa, measuring 9.5 x 7.0 x 6.9 cm. There are several thin septation seen internally within this mass. No mural thickening or nodularity is identified. Left ovary is unremarkable. No adenopathy    Component     Latest Ref Rng 03/29/2022  HE4 - [facility]     0.0 - 96.9 pmol/L 116.0 (H)   Cancer Antigen (CA) 125 - [facility]     0.0 - 38.1 U/mL 6.8     ROMA Score: 14.6% -15.16% (low risk)     03/30/22 TVUS and TAUS  Transvag & Transabd scans performed Hysterectomy Rt complex septated adnexal cyst=10.28 x 6.71 x 7.74cm(avg); septations=0.26cm, 0.21cm, 0.20cm Lt ovary appears wnl No free fluid seen    She is scheduled to have surgery with Dr. Leafy Ro on May 10, 2022.  She presents today to review the potential risk of surgical staging if her intraoperative frozen section demonstrates malignancy.  She does not have significant risk factors for malignancy.  It stated that her mother was diagnosed with uterine cancer however upon further review her mother was in her late 38s with this diagnosis and it sounded like it was associated with a pregnancy.  I suspect it was probably cervical cancer or preinvasive disease rather then uterine malignancy.    Problem List: Patient Active Problem List   Diagnosis Date Noted   History of retinal detachment 06/21/2020   Posterior vitreous detachment, left eye 12/11/2019   Right epiretinal membrane 07/16/2019   Traction detachment of right retina 07/16/2019   S/P lumbar fusion 09/04/2018    Past Medical  History: Past Medical History:  Diagnosis Date   Adenoma of colon    Anxiety    Arthritis    Psoriatic   Coronary artery disease    Cystoid macular edema of right eye 07/16/2019   DDD (degenerative disc disease), lumbar    Depression    Detached retina    has a sown in oil bubble in eye since Jan 17th   GERD (gastroesophageal reflux disease)    Hyperlipidemia    Hypertension    Left leg pain    occasional numbness, R/T "slipped disc" above fusion   Osteoarthritis    Pre-diabetes    Right posterior capsular opacification 07/16/2019    Past Surgical History: Past Surgical History:  Procedure Laterality Date   ABDOMINAL HYSTERECTOMY     ANGIOPLASTY     BACK SURGERY  2015   SPINAL FUSION [facility]   BILATERAL CARPAL TUNNEL RELEASE Bilateral    BREAST BIOPSY Left yrs ago   EXCISIONAL X 2 - NEG   BREAST BIOPSY Right 02/10/2020   [place] Bx, vision clip, neg   BREAST BIOPSY Right 08/24/2020   Stereo bx, x xlip, path pending   CARPAL TUNNEL RELEASE Left 02/27/2018   Procedure: CARPAL TUNNEL RELEASE;  Surgeon: Corky Mull, MD;  Location: [place];  Service: Orthopedics;  Laterality: Left;   CATARACT EXTRACTION Bilateral    Dr. Talbert Forest   CHOLECYSTECTOMY     COLONOSCOPY     COLONOSCOPY WITH PROPOFOL N/A  01/10/2017   Procedure: COLONOSCOPY WITH PROPOFOL;  Surgeon: Manya Silvas, MD;  Location: [facility] END[facility]COPY;  Service: Endoscopy;  Laterality: N/A;   EYE SURGERY Bilateral    Cataract Extraction with IOL   FLEXIBLE SIGMOIDOSCOPY     JOINT REPLACEMENT Bilateral 2003   TOTAL KNEE REPLACEMENTS DR CALIFF [facility]   LUMBAR LAMINECTOMY     POSTERIOR LUMBAR FUSION 4 LEVEL N/A 09/04/2018   Procedure: L3-4 XLIF, L3-S1 PSF (MIS);  Surgeon: Meade Maw, MD;  Location: [facility] ORS;  Service: Neurosurgery;  Laterality: N/A;   RETINAL DETACHMENT SURGERY     SHOULDER ARTHROSCOPY WITH OPEN ROTATOR CUFF REPAIR Right 09/19/2016   Procedure: SHOULDER ARTHROSCOPY WITH MINI OPEN  ROTATOR CUFF REPAIR;  Surgeon: Corky Mull, MD;  Location: [facility] OR[facility];  Service: Orthopedics;  Laterality: Right;  Biceps tenolysis   TENNIS ELBOW RELEASE/NIRSCHEL PROCEDURE Left 02/27/2018   Procedure: SUBCUTANEOUS ANTERIOR TRANSPOSITION OF [place] AT THE ELBOW;  Surgeon: Corky Mull, MD;  Location: [place];  Service: Orthopedics;  Laterality: Left;   TRIGGER FINGER RELEASE     ULNAR NERVE TRANSPOSITION Left 02/21/2017   Procedure: ULNAR NERVE DECOMPRESSION WRIST GUYON'S CANAL;  Surgeon: Corky Mull, MD;  Location: [place];  Service: Orthopedics;  Laterality: Left;    Past Gynecologic History:  Menarche: 13 Menstrual details: Postmenopausal History of Abnormal pap: no,    OB History:  OB History  Gravida Para Term Preterm AB Living  2 2          SAB IAB Ectopic Multiple Live Births               # Outcome Date GA Lbr Len/2nd Weight Sex Delivery Anes PTL Lv  2 Para           1 Para             Obstetric Comments  NSVD x 2    Family History: Family History  Problem Relation Age of Onset   Uterine cancer Mother 105   Diabetes Mother    Diabetes Sister    Breast cancer Neg Hx     Social History: Social History   Socioeconomic History   Marital status: Married    Spouse name: Not on file   Number of children: Not on file   Years of education: Not on file   Highest education level: Not on file  Occupational History   Not on file  Tobacco Use   Smoking status: Never   Smokeless tobacco: Never  Vaping Use   Vaping Use: Never used  Substance and Sexual Activity   Alcohol use: Yes    Comment: glass of wine per month   Drug use: No   Sexual activity: Yes    Birth control/protection: None  Other Topics Concern   Not on file  Social History Narrative   Not on file   Social Determinants of Health   Financial Resource Strain: Not on file  Food Insecurity: Not on file  Transportation Needs: Not on file  Physical Activity: Not on  file  Stress: Not on file  Social Connections: Not on file  Intimate Partner Violence: Not on file    Allergies: Allergies  Allergen Reactions   Hydrocodone Nausea And Vomiting   Oxycodone Other (See Comments)    Caused jitteriness and crying spells    Current Medications: Current Outpatient Medications  Medication Sig Dispense Refill   amLODipine (NORVASC) 5 MG tablet Take 5 mg by mouth  daily.      atorvastatin (LIPITOR) 20 MG tablet Take 20 mg by mouth daily.      b complex vitamins tablet Take 1 tablet by mouth daily.     busPIRone (BUSPAR) 10 MG tablet Take 10 mg by mouth daily.      busPIRone (BUSPAR) 10 MG tablet SMARTSIG:1 Tablet(s) By Mouth Twice Daily     celecoxib (CELEBREX) 200 MG capsule Take 200 mg by mouth 2 (two) times daily.     DULoxetine (CYMBALTA) 60 MG capsule Take 60 mg by mouth daily.  4   estradiol (ESTRACE) 1 MG tablet Take 1 mg by mouth daily.     etanercept (ENBREL SURECLICK) 50 MG/ML injection Inject into the skin.     gabapentin (NEURONTIN) 300 MG capsule Take 300-600 mg by mouth See admin instructions. Take 600 mg by mouth in the morning, 300 mg midday and 600 mg at bedtime     omeprazole (PRILOSEC) 20 MG capsule Take 20 mg by mouth daily.      traZODone (DESYREL) 50 MG tablet Take 50 mg by mouth at bedtime.      valsartan-hydrochlorothiazide (DIOVAN-HCT) 320-12.5 MG tablet Take 1 tablet by mouth daily.      ketorolac (ACULAR) 0.5 % ophthalmic solution Place 1 drop into the left eye 3 (three) times daily. (Patient not taking: Reported on 12/11/2019)  5   ondansetron (ZOFRAN-ODT) 8 MG disintegrating tablet Take 1 tablet (8 mg total) by mouth every 8 (eight) hours as needed for nausea or vomiting. (Patient not taking: Reported on 04/26/2022) 20 tablet 0   No current facility-administered medications for this visit.    Review of Systems General: negative for fevers, changes in weight or night sweats Skin: negative for changes in moles or sores or  rash Eyes: visual issues HEENT: hearing loss Pulmonary: negative for dyspnea, orthopnea, productive cough, wheezing Cardiac: negative for palpitations, pain Gastrointestinal: negative for nausea, vomiting, constipation, diarrhea, hematemesis, hematochezia Genitourinary/Sexual: negative for dysuria, retention, hematuria, incontinence Ob/Gyn:  negative for abnormal bleeding, or pain Musculoskeletal:  joint pain Hematology: negative for easy bruising, abnormal bleeding Neurologic/Psych: negative for headaches, seizures, paralysis, weakness, numbness  Objective:  Physical Examination:  BP 136/80   Pulse 76   Temp 97.8 F (36.6 C)   Resp 20   Wt 163 lb 11.2 oz (74.3 kg)   SpO2 100%   BMI 29.00 kg/m    ECOG Performance Status: 0 - Asymptomatic  GENERAL: Patient is a well appearing female in no acute distress HEENT:  Atraumatic and normocephalic. PERRL, neck supple. NODES:  No cervical, supraclavicular, axillary, or inguinal lymphadenopathy palpated.  LUNGS:  Normal respiratory rate ABDOMEN:  Soft, nontender. Nondistended. No masses/ascites/hernia/or hepatomegaly.  EXTREMITIES:  No peripheral edema.   SKIN: Abdominal incisions all intact NEURO:  Nonfocal. Well oriented.  Appropriate affect.  Pelvic: EGBUS: no lesions Cervix: surgically absent Vagina: no lesions, no discharge or bleeding Uterus: surgically absent BME: no palpable masses Rectovaginal: deferred   Lab Review As noted per HPI  Radiologic Imaging: As noted per HPI and CT below Narrative & Impression  CLINICAL DATA:  Abdominal pain   EXAM: CT ABDOMEN AND PELVIS WITH CONTRAST   TECHNIQUE: Multidetector CT imaging of the abdomen and pelvis was performed using the standard protocol following bolus administration of intravenous contrast.   RADIATION DOSE REDUCTION: This exam was performed according to the departmental dose-optimization program which includes automated exposure control, adjustment of the mA  and/or kV according to patient size and/or use  of iterative reconstruction technique.   CONTRAST:  114m OMNIPAQUE IOHEXOL 300 MG/ML  SOLN   COMPARISON:  06/06/2011   FINDINGS: Lower chest: No acute pleural or parenchymal lung disease.   Hepatobiliary: Stable 8 mm hemangioma right lobe liver. Diffuse hepatic steatosis. No biliary duct dilation. Cholecystectomy.   Pancreas: Unremarkable. No pancreatic ductal dilatation or surrounding inflammatory changes.   Spleen: Normal in size without focal abnormality.   Adrenals/Urinary Tract: Adrenal glands are unremarkable. Kidneys are normal, without renal calculi, focal lesion, or hydronephrosis. Bladder is unremarkable.   Stomach/Bowel: No bowel obstruction or ileus. Normal appendix right lower quadrant. No bowel wall thickening or inflammatory change.   Vascular/Lymphatic: Aortic atherosclerosis. No enlarged abdominal or pelvic lymph nodes.   Reproductive: Previous hysterectomy. Multilocular cystic mass is seen within the right adnexa, measuring 9.5 x 7.0 x 6.9 cm. There are several thin septation seen internally within this mass. No mural thickening or nodularity is identified. Left ovary is unremarkable.   Other: No free fluid or free intraperitoneal gas. No abdominal wall hernia.   Musculoskeletal: No acute or destructive bony lesions. Postsurgical changes are seen from discectomy spanning L3-4 through L5-S1. Posterior fusion hardware is seen within the L3, L4, L5, and S1 vertebral bodies. There is progressive spondylosis at the L2-3 level since prior exam. Reconstructed images demonstrate no additional findings.   IMPRESSION: 1. Multilocular cystic right adnexal mass measuring up to 9.5 cm, concerning for cystic ovarian neoplasm in a patient of this age. Gynecologic consultation and follow-up outpatient MRI may be useful for further characterization. 2. Hepatic steatosis. 3. Postsurgical changes lower lumbar spine, with  progressive spondylosis at the L2-3 level as above. No acute bony abnormality. 4.  Aortic Atherosclerosis (ICD10-I70.0).      Assessment:  VJACQULYNN SHAPPELLis a 77y.o. female diagnosed with asymptomatic slightly complex adnexal mass with low risk Roma score.  Patient desires definitive surgery to rule out malignancy.    Medical co-morbidities complicating care: prior abdominal surgery. H/o HTN well controlled.  Plan:   Problem List Items Addressed This Visit   None Visit Diagnoses     Adnexal mass    -  Primary   Counseling and coordination of care          We agree with Dr. BMigdalia Dkrecommendations for surgical evaluation and will try to be available as standby if she has intraoperative diagnosis of malignancy.  We discussed the role of surgical staging including pelvic empiric lymph node dissection peritoneal biopsies and omentectomy.  Reviewed possible need for laparotomy and risks of surgery.   Risks were discussed in detail. These include infection, anesthesia, bleeding, transfusion, wound separation, vaginal cuff dehiscence, medical issues (blood clots, stroke, heart attack, fluid in the lungs, pneumonia, abnormal heart rhythm, death), possible exploratory surgery with larger incision, lymphedema, lymphocyst, allergic reaction, injury to adjacent organs (bowel, bladder, blood vessels, nerves, ureters).  We discussed that given her multiple intra-abdominal surgeries she may be at risk for adhesive disease.  We will evaluate that during the surgery.  In the presence of adhesive disease risks are increased.  Due to scheduling issues issues we may not be available for the procedure.  I have reviewed the imaging and the tumor markers.  I think this is low risk for malignancy and if absolutely needed given the patient's desire to proceed with surgery I believe it is very reasonable for Dr. BLeafy Roto perform the procedure  Suggested return to clinic if needed.   The patient's diagnosis,  an outline of the further diagnostic and laboratory studies which will be required, the recommendation, and alternatives were discussed.  All questions were answered to the patient's satisfaction.  A total of 70 minutes were spent with the patient/family today; 50 % was spent in education, counseling and coordination of care for adnexal mass.  Yarel Kilcrease Gaetana Michaelis, MD    CC:   Dr. Benjaman Kindler

## 2022-05-03 ENCOUNTER — Encounter: Payer: Self-pay | Admitting: Obstetrics and Gynecology

## 2022-05-03 ENCOUNTER — Encounter
Admission: RE | Admit: 2022-05-03 | Discharge: 2022-05-03 | Disposition: A | Discharge status: Home / Self Care | Payer: Medicare Other | Source: Ambulatory Visit | Attending: Obstetrics and Gynecology | Admitting: Obstetrics and Gynecology

## 2022-05-03 DIAGNOSIS — Z01818 Encounter for other preprocedural examination: Secondary | ICD-10-CM

## 2022-05-03 HISTORY — DX: Arthropathic psoriasis, unspecified: L40.50

## 2022-05-03 HISTORY — DX: Type 2 diabetes mellitus without complications: E11.9

## 2022-05-03 HISTORY — DX: Other specified conditions associated with female genital organs and menstrual cycle: N94.89

## 2022-05-03 NOTE — Patient Instructions (Signed)
Your procedure is scheduled on:05-10-22 Wednesday Report to the Registration Desk on the 1st floor of the [facility].Then proceed to the 2nd floor Surgery Desk To find out your arrival time, please call [phone] between 1PM - 3PM on:05-09-22 Tuesday If your arrival time is 6:00 am, do not arrive before that time as the [place] entrance doors do not open until 6:00 am.  REMEMBER: Instructions that are not followed completely may result in serious medical risk, up to and including death; or upon the discretion of your surgeon and anesthesiologist your surgery may need to be rescheduled.  Do not eat food after midnight the night before surgery.  No gum chewing or hard candies.  You may however, drink CLEAR liquids up to 2 hours before you are scheduled to arrive for your surgery. Do not drink anything within 2 hours of your scheduled arrival time.  Clear liquids include: - water  - apple juice without pulp - gatorade (not RED colors) - black coffee or tea (Do NOT add milk or creamers to the coffee or tea) Do NOT drink anything that is not on this list.  One week prior to surgery: Stop Anti-inflammatories (NSAIDS) such as Advil, Aleve, Ibuprofen, Motrin, Naproxen, Naprosyn and Aspirin based products such as Excedrin, Goody's Powder, BC Powder.You may however, continue to take Tylenol if needed for pain up until the day of surgery. Continue your celecoxib (CELEBREX) up until the day prior to surgery  [place] supplements/vitamins NOW (05-03-22) until after surgery.  TAKE ONLY THESE MEDICATIONS THE MORNING OF SURGERY WITH A SIP OF WATER: -amLODipine (NORVASC)  -atorvastatin (LIPITOR)  -busPIRone (BUSPAR)  -DULoxetine (CYMBALTA)  -gabapentin (NEURONTIN)  -omeprazole (PRILOSEC)-take one the night before and one on the morning of surgery - helps to prevent nausea after surgery.)  No Alcohol for 24 hours before or after surgery.  No Smoking including e-cigarettes  for 24 hours before surgery.  No chewable tobacco products for at least 6 hours before surgery.  No nicotine patches on the day of surgery.  Do not use any "recreational" drugs for at least a week (preferably 2 weeks) before your surgery.  Please be advised that the combination of cocaine and anesthesia may have negative outcomes, up to and including death. If you test positive for cocaine, your surgery will be cancelled.  On the morning of surgery brush your teeth with toothpaste and water, you may rinse your mouth with mouthwash if you wish. Do not swallow any toothpaste or mouthwash.  Use CHG Soap as directed on instruction sheet.  Do not wear jewelry, make-up, hairpins, clips or nail polish.  Do not wear lotions, powders, or perfumes.   Do not shave body hair from the neck down 48 hours before surgery.  Contact lenses, hearing aids and dentures may not be worn into surgery.  Do not bring valuables to the hospital. [facility] is not responsible for any missing/lost belongings or valuables.   Notify your doctor if there is any change in your medical condition (cold, fever, infection).  Wear comfortable clothing (specific to your surgery type) to the hospital.  After surgery, you can help prevent lung complications by doing breathing exercises.  Take deep breaths and cough every 1-2 hours. Your doctor may order a device called an Incentive Spirometer to help you take deep breaths. When coughing or sneezing, hold a pillow firmly against your incision with both hands. This is called "splinting." Doing this helps protect your incision. It also  decreases belly discomfort.  If you are being admitted to the hospital overnight, leave your suitcase in the car. After surgery it may be brought to your room.  In case of increased patient census, it may be necessary for you, the patient, to continue your postoperative care in the Same Day Surgery department.  If you are being discharged the  day of surgery, you will not be allowed to drive home. You will need a responsible individual to drive you home and stay with you for 24 hours after surgery.   If you are taking public transportation, you will need to have a responsible individual with you.  Please call the [place] Dept. at [phone] if you have any questions about these instructions.  Surgery Visitation Policy:  Patients undergoing a surgery or procedure may have two family members or support persons with them as long as the person is not COVID-19 positive or experiencing its symptoms.   Due to an increase in RSV and influenza rates and associated hospitalizations, children ages 21 and under will not be able to visit patients in [facility]. Masks continue to be strongly recommended.

## 2022-05-04 NOTE — H&P (Signed)
[name] is a 77 y.o. female here for Pre Op Consulting (Follow up labs and u/s for adnexal mass, BEB spoke to pt but per pt request, wants to go ahead and schedule surgery now as opposed to watiting)   Referring provider: Genice Rouge*   History of Present Illness: Patient returns today to review lab and ultrasound results and discuss next steps. She has a 10 cm right complex cyst first seen on CT in 02/2022 at 9.5 cm    TVUS and TAUS 03/30/22  Transvag & Transabd scans performed Hysterectomy Rt complex septated adnexal cyst=10.28 x 6.71 x 7.74cm(avg); septations=0.26cm, 0.21cm, 0.20cm Lt ovary appears wnl No free fluid seen     Component     Latest Ref Rng 03/29/2022 HE4 - [facility]     0.0 - 96.9 pmol/L 116.0 (H)  Cancer Antigen (CA) 125 - [facility]     0.0 - 38.1 U/mL 6.8    ROMA Score: 14.6% -15.16% (low risk)   Pertinent Hx: - S/p hysterectomy  - SVD x 2    Past Medical History:  has a past medical history of Adenomatous polyps, Awareness under anesthesia (1990s), Borderline diabetes mellitus, Depression, Diabetes mellitus type 2, uncomplicated (CMS-HCC), Hyperlipidemia, Hypertension, Lumbar disc disease, Osteoarthritis ([place]) (11/07/2013), Personal history of other specified diseases(V13.89), and Psoriatic arthritis (CMS-HCC) (2012).  Past Surgical History:  has a past surgical history that includes Vaginal hysterectomy (1989); Cholecystectomy (~2012); Percutaneous Biopsy Breast (Left, 1973, 1979); RELEASE OF TRIGGER FINGERS (Bilateral, 2000s); Endoscopic Carpal Tunnel Release (Bilateral, 1990s); Tonsillectomy (~1965); laminectomy posterior lumbar facetectomy & foraminotomy w/decomp (N/A, 12/27/2011); Arthroplasty Total Knee (Left, 1990s); Colonoscopy (01/18/1998); flexible sigmoidoscopy (10/16/1997); Knee arthroscopy; Arthroplasty Total Knee (Right, 1990s); Coronary angioplasty; arthrodesis posterior lumbar spine w/laminectomy/discectomy (Bilateral, 11/20/2014);  instrumentation posterior spine 1/2 vertebral segments w/o fixation (Bilateral, 11/20/2014); insertion morselized bone allograft for spine surgery (Bilateral, 11/20/2014); autograft obtained same incision for spine surgery (Bilateral, 11/20/2014); Back surgery; Joint replacement; Extensive arthroscopic debridement wiht biceps tenolysis,arthroscopic subacromail decompression and mini-open rotator cuff repair, right shoulder  (Right, 09/19/2016); Colonoscopy (01/10/2017); Eye surgeries x 2 to correct detached retina; Open decompression of ulnar nerve at [facility], left wrist  (Left, 02/21/2017); Open left carpal tunnel release and anterior subcutaneous transposition of ulnar nerve left elbow  (Left, 02/27/2018); L3-4 XLIF, L3-S1 PSF (09/04/2018); and right eye surgery (Right, 11/06/2018). Family History: family history includes Cancer in her son; Coronary Artery Disease (Blocked arteries around heart) in her father and sister; Diabetes type II in her mother and sister; Osteoarthritis in her mother; Uterine cancer in her mother. Social History:  reports that she has never smoked. She has never been exposed to tobacco smoke. She has never used smokeless tobacco. She reports that she does not drink alcohol and does not use drugs. OB/GYN History:  OB History       Gravida 2   Para 2   Term 2   Preterm     AB     Living 2       SAB     IAB     Ectopic     Molar     Multiple     Live Births 2        Allergies: is allergic to cat/feline products, hydrocodone, and oxycodone. Medications: Current Outpatient Medications:    amLODIPine (NORVASC) 5 MG tablet, TAKE 1 TABLET BY MOUTH DAILY, Disp: 90 tablet, Rfl: 1   atorvastatin (LIPITOR) 40 MG tablet, TAKE ONE TABLET EVERY DAY, Disp: 90 tablet, Rfl:  1   busPIRone (BUSPAR) 10 MG tablet, TAKE 1 TABLET BY MOUTH TWICE DAILY, Disp: 180 tablet, Rfl: 1   calcium carbonate-vitamin D3 (CALTRATE 600+D) 600 mg(1,'500mg'$ ) -400 unit  tablet, Take 1 tablet by mouth 2 (two) times daily with meals, Disp: 60 tablet, Rfl: 11   celecoxib (CELEBREX) 200 MG capsule, TAKE 1 CAPSULE BY MOUTH TWO TIMES DAILY, Disp: 180 capsule, Rfl: 1   DULoxetine (CYMBALTA) 60 MG DR capsule, TAKE 1 CAPSULE BY MOUTH ONCE DAILY, Disp: 90 capsule, Rfl: 1   estradioL (ESTRACE) 1 MG tablet, TAKE 1 TABLET BY MOUTH DAILY, Disp: 90 tablet, Rfl: 3   etanercept (ENBREL SURECLICK) 50 mg/mL (1 mL) pen injector, Inject 1 mL (50 mg total) subcutaneously once a week, Disp: 12 mL, Rfl: 1   gabapentin (NEURONTIN) 300 MG capsule, TAKE 1 CAPSULE BY MOUTH EVERY MORNING AND 2 CAPSULES AT BEDTIME, Disp: 270 capsule, Rfl: 1   loratadine (CLARITIN) 10 mg tablet, Take 10 mg by mouth once daily as needed, Disp: , Rfl:    omeprazole (PRILOSEC) 20 MG DR capsule, TAKE 1 CAPSULE BY MOUTH ONCE DAILY, Disp: 30 capsule, Rfl: 12   ondansetron (ZOFRAN-ODT) 8 MG disintegrating tablet, Take 8 mg by mouth every 8 (eight) hours as needed, Disp: , Rfl:    traZODone (DESYREL) 50 MG tablet, TAKE ONE TABLET AT BEDTIME IF NEEDED FORSLEEP, Disp: 90 tablet, Rfl: 1   valsartan-hydroCHLOROthiazide (DIOVAN-HCT) 320-12.5 mg tablet, TAKE 1 TABLET BY MOUTH DAILY, Disp: 90 tablet, Rfl: 1   Review of Systems: No SOB, no palpitations or chest pain, no new lower extremity edema, no nausea or vomiting or bowel or bladder complaints. See HPI for gyn specific ROS.    Exam:   BP 125/73   Pulse 86   Ht 160 cm ('5\' 3"'$ )   Wt 72.6 kg (160 lb)   BMI 28.34 kg/m     Constitutional:  General appearance: Well nourished, well developed female in no acute distress.  Neuro/psych:  Normal mood and affect. No gross motor deficits. Neck:  Supple, normal appearance.  Respiratory:  Normal respiratory effort, no use of accessory muscles Skin:  No visible rashes or external lesions    Impression:   The encounter diagnosis was Adnexal mass.   Plan:   - Complex adnexal mass:  - Discussed with pt and her husband the  ultrasound results and images today. We discussed her options for repeat labs and ultrasound, referral to GYN oncology for surgical consult or continue with surgical removal with me.  - She and her husband would like to go ahead with surgery as soon as possible.  - Return for preop for RA BSO    Will position while awake d/t hx of back pain.

## 2022-05-05 ENCOUNTER — Encounter
Admission: RE | Admit: 2022-05-05 | Discharge: 2022-05-05 | Disposition: A | Discharge status: Home / Self Care | Payer: Medicare Other | Source: Ambulatory Visit | Attending: Obstetrics and Gynecology | Admitting: Obstetrics and Gynecology

## 2022-05-05 DIAGNOSIS — I25119 Atherosclerotic heart disease of native coronary artery with unspecified angina pectoris: Secondary | ICD-10-CM | POA: Diagnosis not present

## 2022-05-05 DIAGNOSIS — Z01818 Encounter for other preprocedural examination: Secondary | ICD-10-CM | POA: Diagnosis present

## 2022-05-05 DIAGNOSIS — N838 Other noninflammatory disorders of ovary, fallopian tube and broad ligament: Secondary | ICD-10-CM | POA: Insufficient documentation

## 2022-05-05 DIAGNOSIS — I10 Essential (primary) hypertension: Secondary | ICD-10-CM | POA: Diagnosis not present

## 2022-05-05 DIAGNOSIS — Z01810 Encounter for preprocedural cardiovascular examination: Secondary | ICD-10-CM

## 2022-05-05 LAB — CBC
HCT: 33.6 % — ABNORMAL LOW (ref 36.0–46.0)
Hemoglobin: 11.7 g/dL — ABNORMAL LOW (ref 12.0–15.0)
MCH: 30.5 pg (ref 26.0–34.0)
MCHC: 34.8 g/dL (ref 30.0–36.0)
MCV: 87.7 fL (ref 80.0–100.0)
Platelets: 262 10*3/uL (ref 150–400)
RBC: 3.83 MIL/uL — ABNORMAL LOW (ref 3.87–5.11)
RDW: 12.2 % (ref 11.5–15.5)
WBC: 5.3 10*3/uL (ref 4.0–10.5)
nRBC: 0 % (ref 0.0–0.2)

## 2022-05-05 LAB — TYPE AND SCREEN
ABO/RH(D): A POS
Antibody Screen: NEGATIVE

## 2022-05-05 LAB — BASIC METABOLIC PANEL
Anion gap: 10 (ref 5–15)
BUN: 16 mg/dL (ref 8–23)
CO2: 24 mmol/L (ref 22–32)
Calcium: 9 mg/dL (ref 8.9–10.3)
Chloride: 99 mmol/L (ref 98–111)
Creatinine, Ser: 0.82 mg/dL (ref 0.44–1.00)
GFR, Estimated: >60 mL/min (ref >60)
Glucose, Bld: 230 mg/dL — ABNORMAL HIGH (ref 70–99)
Potassium: 3.1 mmol/L — ABNORMAL LOW (ref 3.5–5.1)
Sodium: 133 mmol/L — ABNORMAL LOW (ref 135–145)

## 2022-05-09 MED ORDER — ORAL CARE MOUTH RINSE: 15.0000 mL | Freq: Once | OROMUCOSAL | Status: DC

## 2022-05-09 MED ORDER — ACETAMINOPHEN 500 MG PO TABS: 1000.0000 mg | ORAL_TABLET | ORAL | Status: DC

## 2022-05-09 MED ORDER — POVIDONE-IODINE 10 % EX SWAB: 2.0000 | Freq: Once | CUTANEOUS | Status: DC

## 2022-05-09 MED ORDER — SODIUM CHLORIDE 0.9 % IV SOLN: INTRAVENOUS | Status: DC

## 2022-05-09 MED ORDER — LACTATED RINGERS IV SOLN: INTRAVENOUS | Status: DC

## 2022-05-09 MED ORDER — CHLORHEXIDINE GLUCONATE 0.12 % MT SOLN: 15.0000 mL | Freq: Once | OROMUCOSAL | Status: DC

## 2022-05-09 MED ORDER — GABAPENTIN 300 MG PO CAPS: 300.0000 mg | ORAL_CAPSULE | ORAL | Status: DC

## 2022-05-10 ENCOUNTER — Ambulatory Visit: Payer: Medicare Other | Admitting: Urgent Care

## 2022-05-10 ENCOUNTER — Ambulatory Visit
Admission: RE | Admit: 2022-05-10 | Discharge: 2022-05-10 | Disposition: A | Discharge status: Home / Self Care | Payer: Medicare Other | Attending: Obstetrics and Gynecology | Admitting: Obstetrics and Gynecology

## 2022-05-10 ENCOUNTER — Other Ambulatory Visit: Payer: Self-pay

## 2022-05-10 ENCOUNTER — Encounter: Payer: Self-pay | Admitting: Obstetrics and Gynecology

## 2022-05-10 ENCOUNTER — Encounter
Admission: RE | Disposition: A | Discharge status: Home / Self Care | Payer: Self-pay | Source: Home / Self Care | Attending: Obstetrics and Gynecology

## 2022-05-10 DIAGNOSIS — N83292 Other ovarian cyst, left side: Secondary | ICD-10-CM | POA: Insufficient documentation

## 2022-05-10 DIAGNOSIS — N838 Other noninflammatory disorders of ovary, fallopian tube and broad ligament: Secondary | ICD-10-CM | POA: Diagnosis not present

## 2022-05-10 DIAGNOSIS — Z8249 Family history of ischemic heart disease and other diseases of the circulatory system: Secondary | ICD-10-CM | POA: Diagnosis not present

## 2022-05-10 DIAGNOSIS — Z833 Family history of diabetes mellitus: Secondary | ICD-10-CM | POA: Diagnosis not present

## 2022-05-10 DIAGNOSIS — I2510 Atherosclerotic heart disease of native coronary artery without angina pectoris: Secondary | ICD-10-CM | POA: Insufficient documentation

## 2022-05-10 DIAGNOSIS — I10 Essential (primary) hypertension: Secondary | ICD-10-CM | POA: Insufficient documentation

## 2022-05-10 DIAGNOSIS — E119 Type 2 diabetes mellitus without complications: Secondary | ICD-10-CM | POA: Insufficient documentation

## 2022-05-10 DIAGNOSIS — N839 Noninflammatory disorder of ovary, fallopian tube and broad ligament, unspecified: Secondary | ICD-10-CM | POA: Diagnosis present

## 2022-05-10 DIAGNOSIS — K219 Gastro-esophageal reflux disease without esophagitis: Secondary | ICD-10-CM | POA: Insufficient documentation

## 2022-05-10 DIAGNOSIS — Z90710 Acquired absence of both cervix and uterus: Secondary | ICD-10-CM | POA: Diagnosis not present

## 2022-05-10 DIAGNOSIS — I25119 Atherosclerotic heart disease of native coronary artery with unspecified angina pectoris: Secondary | ICD-10-CM

## 2022-05-10 DIAGNOSIS — D270 Benign neoplasm of right ovary: Secondary | ICD-10-CM | POA: Insufficient documentation

## 2022-05-10 DIAGNOSIS — Z01810 Encounter for preprocedural cardiovascular examination: Secondary | ICD-10-CM

## 2022-05-10 HISTORY — PX: ROBOTIC ASSISTED BILATERAL SALPINGO OOPHERECTOMY: SHX6078

## 2022-05-10 HISTORY — PX: LYSIS OF ADHESION: SHX5961

## 2022-05-10 HISTORY — PX: CYSTOSCOPY: SHX5120

## 2022-05-10 LAB — GLUCOSE, CAPILLARY
Glucose-Capillary: 108 mg/dL — ABNORMAL HIGH (ref 70–99)
Glucose-Capillary: 149 mg/dL — ABNORMAL HIGH (ref 70–99)

## 2022-05-10 SURGERY — SALPINGO-OOPHORECTOMY, BILATERAL, ROBOT-ASSISTED
Anesthesia: General | Site: Pelvis

## 2022-05-10 MED ORDER — PROPOFOL 1000 MG/100ML IV EMUL
INTRAVENOUS | Status: AC
  Filled 2022-05-10: qty 100

## 2022-05-10 MED ORDER — ONDANSETRON HCL 4 MG/2ML IJ SOLN
INTRAMUSCULAR | Status: DC | PRN
  Administered 2022-05-10: 4 mg via INTRAVENOUS

## 2022-05-10 MED ORDER — HYDROCODONE-ACETAMINOPHEN 7.5-325 MG PO TABS
1.0000 | ORAL_TABLET | Freq: Once | ORAL | Status: DC | PRN
  Filled 2022-05-10: qty 1

## 2022-05-10 MED ORDER — HYDROMORPHONE HCL 2 MG PO TABS: 2.0000 mg | ORAL_TABLET | Freq: Four times a day (QID) | ORAL | Status: DC | PRN

## 2022-05-10 MED ORDER — KETAMINE HCL 50 MG/5ML IJ SOSY
PREFILLED_SYRINGE | INTRAMUSCULAR | Status: AC
  Filled 2022-05-10: qty 5

## 2022-05-10 MED ORDER — PHENYLEPHRINE HCL-NACL 20-0.9 MG/250ML-% IV SOLN
INTRAVENOUS | Status: DC | PRN
  Administered 2022-05-10: 20 ug/min via INTRAVENOUS

## 2022-05-10 MED ORDER — ACETAMINOPHEN 10 MG/ML IV SOLN
INTRAVENOUS | Status: DC | PRN
  Administered 2022-05-10: 1000 mg via INTRAVENOUS

## 2022-05-10 MED ORDER — PROPOFOL 10 MG/ML IV BOLUS
INTRAVENOUS | Status: AC
  Filled 2022-05-10: qty 40

## 2022-05-10 MED ORDER — LIDOCAINE HCL (CARDIAC) PF 100 MG/5ML IV SOSY
PREFILLED_SYRINGE | INTRAVENOUS | Status: DC | PRN
  Administered 2022-05-10: 80 mg via INTRAVENOUS

## 2022-05-10 MED ORDER — LACTATED RINGERS IV SOLN: INTRAVENOUS | Status: DC | PRN

## 2022-05-10 MED ORDER — HYDROMORPHONE HCL 2 MG PO TABS
ORAL_TABLET | ORAL | Status: AC
  Administered 2022-05-10: 2 mg via ORAL
  Filled 2022-05-10: qty 1

## 2022-05-10 MED ORDER — HYDROMORPHONE HCL 2 MG PO TABS: 2.0000 mg | ORAL_TABLET | ORAL | 0 refills | Status: DC | PRN

## 2022-05-10 MED ORDER — FENTANYL CITRATE (PF) 100 MCG/2ML IJ SOLN
INTRAMUSCULAR | Status: AC
  Filled 2022-05-10: qty 2

## 2022-05-10 MED ORDER — HYDROMORPHONE HCL 1 MG/ML IJ SOLN
INTRAMUSCULAR | Status: DC | PRN
  Administered 2022-05-10: .5 mg via INTRAVENOUS

## 2022-05-10 MED ORDER — BUPIVACAINE HCL (PF) 0.5 % IJ SOLN
INTRAMUSCULAR | Status: DC | PRN
  Administered 2022-05-10: 20 mL

## 2022-05-10 MED ORDER — 0.9 % SODIUM CHLORIDE (POUR BTL) OPTIME
TOPICAL | Status: DC | PRN
  Administered 2022-05-10: 500 mL

## 2022-05-10 MED ORDER — HYDROMORPHONE HCL 1 MG/ML IJ SOLN
INTRAMUSCULAR | Status: AC
  Filled 2022-05-10: qty 1

## 2022-05-10 MED ORDER — ACETAMINOPHEN 10 MG/ML IV SOLN
INTRAVENOUS | Status: AC
  Filled 2022-05-10: qty 100

## 2022-05-10 MED ORDER — DEXMEDETOMIDINE HCL IN NACL 80 MCG/20ML IV SOLN
INTRAVENOUS | Status: DC | PRN
  Administered 2022-05-10: 4 ug via BUCCAL
  Administered 2022-05-10: 8 ug via BUCCAL

## 2022-05-10 MED ORDER — IBUPROFEN 800 MG PO TABS: 800.0000 mg | ORAL_TABLET | Freq: Three times a day (TID) | ORAL | 1 refills | Status: AC

## 2022-05-10 MED ORDER — SUGAMMADEX SODIUM 200 MG/2ML IV SOLN
INTRAVENOUS | Status: DC | PRN
  Administered 2022-05-10: 200 mg via INTRAVENOUS

## 2022-05-10 MED ORDER — PROPOFOL 10 MG/ML IV BOLUS
INTRAVENOUS | Status: DC | PRN
  Administered 2022-05-10: 120 mg via INTRAVENOUS
  Administered 2022-05-10: 50 ug/kg/min via INTRAVENOUS

## 2022-05-10 MED ORDER — KETAMINE HCL 10 MG/ML IJ SOLN
INTRAMUSCULAR | Status: DC | PRN
  Administered 2022-05-10: 30 mg via INTRAVENOUS
  Administered 2022-05-10: 20 mg via INTRAVENOUS

## 2022-05-10 MED ORDER — KETOROLAC TROMETHAMINE 30 MG/ML IJ SOLN
INTRAMUSCULAR | Status: DC | PRN
  Administered 2022-05-10: 15 mg via INTRAVENOUS

## 2022-05-10 MED ORDER — BUPIVACAINE HCL (PF) 0.5 % IJ SOLN
INTRAMUSCULAR | Status: AC
  Filled 2022-05-10: qty 30

## 2022-05-10 MED ORDER — DEXAMETHASONE SODIUM PHOSPHATE 10 MG/ML IJ SOLN
INTRAMUSCULAR | Status: DC | PRN
  Administered 2022-05-10: 10 mg via INTRAVENOUS

## 2022-05-10 MED ORDER — FENTANYL CITRATE (PF) 100 MCG/2ML IJ SOLN
INTRAMUSCULAR | Status: DC | PRN
  Administered 2022-05-10: 75 ug via INTRAVENOUS
  Administered 2022-05-10: 25 ug via INTRAVENOUS

## 2022-05-10 MED ORDER — ROCURONIUM BROMIDE 100 MG/10ML IV SOLN
INTRAVENOUS | Status: DC | PRN
  Administered 2022-05-10: 20 mg via INTRAVENOUS
  Administered 2022-05-10: 50 mg via INTRAVENOUS
  Administered 2022-05-10: 20 mg via INTRAVENOUS

## 2022-05-10 MED ORDER — ACETAMINOPHEN EXTRA STRENGTH 500 MG PO TABS: 1000.0000 mg | ORAL_TABLET | Freq: Four times a day (QID) | ORAL | 0 refills | Status: AC

## 2022-05-10 MED ORDER — PHENYLEPHRINE HCL-NACL 20-0.9 MG/250ML-% IV SOLN
INTRAVENOUS | Status: AC
  Filled 2022-05-10: qty 250

## 2022-05-10 MED ORDER — DOCUSATE SODIUM 100 MG PO CAPS: 100.0000 mg | ORAL_CAPSULE | Freq: Two times a day (BID) | ORAL | 0 refills | Status: DC

## 2022-05-10 MED ORDER — HYDROMORPHONE HCL 1 MG/ML IJ SOLN: 0.2500 mg | INTRAMUSCULAR | Status: DC | PRN

## 2022-05-10 MED ORDER — DROPERIDOL 2.5 MG/ML IJ SOLN: 0.6250 mg | Freq: Once | INTRAMUSCULAR | Status: DC | PRN

## 2022-05-10 SURGICAL SUPPLY — 70 items
ADH SKN CLS APL DERMABOND .7 (GAUZE/BANDAGES/DRESSINGS) ×3
APL SRG 38 LTWT LNG FL B (MISCELLANEOUS) ×3
APPLICATOR ARISTA FLEXITIP XL (MISCELLANEOUS) IMPLANT
BAG DRN RND TRDRP ANRFLXCHMBR (UROLOGICAL SUPPLIES) ×3
BAG LAPAROSCOPIC 12 15 PORT 16 (BASKET) IMPLANT
BAG RETRIEVAL 12/15 (BASKET) ×3
BAG URINE DRAIN 2000ML AR STRL (UROLOGICAL SUPPLIES) ×3 IMPLANT
BLADE SURG SZ11 CARB STEEL (BLADE) ×3 IMPLANT
CANNULA REDUC XI 12-8 STAPL (CANNULA) ×3
CANNULA REDUCER 12-8 DVNC XI (CANNULA) IMPLANT
CATH FOLEY 2WAY  5CC 16FR (CATHETERS) ×3
CATH FOLEY 2WAY 5CC 16FR (CATHETERS) ×3
CATH URTH 16FR FL 2W BLN LF (CATHETERS) ×3 IMPLANT
COUNTER NEEDLE 20/40 LG (NEEDLE) ×3 IMPLANT
COVER TIP SHEARS 8 DVNC (MISCELLANEOUS) ×3 IMPLANT
COVER TIP SHEARS 8MM DA VINCI (MISCELLANEOUS) ×3
COVER WAND RF STERILE (DRAPES) ×3 IMPLANT
DERMABOND ADVANCED .7 DNX12 (GAUZE/BANDAGES/DRESSINGS) ×3 IMPLANT
DRAPE 3/4 80X56 (DRAPES) ×3 IMPLANT
DRAPE ARM DVNC X/XI (DISPOSABLE) ×9 IMPLANT
DRAPE COLUMN DVNC XI (DISPOSABLE) ×3 IMPLANT
DRAPE DA VINCI XI ARM (DISPOSABLE) ×9
DRAPE DA VINCI XI COLUMN (DISPOSABLE) ×3
DRAPE ROBOT W/ LEGGING 30X125 (DRAPES) ×3 IMPLANT
ELECT REM PT RETURN 9FT ADLT (ELECTROSURGICAL) ×3
ELECTRODE REM PT RTRN 9FT ADLT (ELECTROSURGICAL) ×3 IMPLANT
GLOVE BIO SURGEON STRL SZ7 (GLOVE) ×12 IMPLANT
GLOVE SURG UNDER LTX SZ7.5 (GLOVE) ×12 IMPLANT
GOWN STRL REUS W/ TWL LRG LVL3 (GOWN DISPOSABLE) ×12 IMPLANT
GOWN STRL REUS W/TWL LRG LVL3 (GOWN DISPOSABLE) ×12
GRASPER SUT TROCAR 14GX15 (MISCELLANEOUS) ×3 IMPLANT
HEMOSTAT ARISTA ABSORB 3G PWDR (HEMOSTASIS) IMPLANT
IRRIGATION STRYKERFLOW (MISCELLANEOUS) IMPLANT
IRRIGATOR STRYKERFLOW (MISCELLANEOUS) ×3
IV NS 1000ML (IV SOLUTION) ×3
IV NS 1000ML BAXH (IV SOLUTION) IMPLANT
KIT PINK PAD W/HEAD ARE REST (MISCELLANEOUS) ×3
KIT PINK PAD W/HEAD ARM REST (MISCELLANEOUS) ×3 IMPLANT
LABEL OR SOLS (LABEL) ×3 IMPLANT
MANIFOLD NEPTUNE II (INSTRUMENTS) ×3 IMPLANT
MANIPULATOR UTERINE 4.5 ZUMI (MISCELLANEOUS) ×3 IMPLANT
NS IRRIG 1000ML POUR BTL (IV SOLUTION) ×3 IMPLANT
NS IRRIG 500ML POUR BTL (IV SOLUTION) IMPLANT
OBTURATOR OPTICAL STANDARD 8MM (TROCAR) ×3
OBTURATOR OPTICAL STND 8 DVNC (TROCAR) ×3
OBTURATOR OPTICALSTD 8 DVNC (TROCAR) ×3 IMPLANT
PACK GYN LAPAROSCOPIC (MISCELLANEOUS) ×3 IMPLANT
PAD OB MATERNITY 4.3X12.25 (PERSONAL CARE ITEMS) ×3 IMPLANT
PAD PREP 24X41 OB/GYN DISP (PERSONAL CARE ITEMS) ×3 IMPLANT
SCRUB CHG 4% DYNA-HEX 4OZ (MISCELLANEOUS) ×3 IMPLANT
SEAL CANN UNIV 5-8 DVNC XI (MISCELLANEOUS) ×9 IMPLANT
SEAL XI 5MM-8MM UNIVERSAL (MISCELLANEOUS) ×6
SEALER VESSEL DA VINCI XI (MISCELLANEOUS) ×3
SEALER VESSEL EXT DVNC XI (MISCELLANEOUS) IMPLANT
SET CYSTO W/LG BORE CLAMP LF (SET/KITS/TRAYS/PACK) IMPLANT
SET TUBE SMOKE EVAC HIGH FLOW (TUBING) ×3 IMPLANT
SOL ELECTROSURG ANTI STICK (MISCELLANEOUS) ×3
SOL PREP PVP 2OZ (MISCELLANEOUS) ×3
SOLUTION ELECTROSURG ANTI STCK (MISCELLANEOUS) ×3 IMPLANT
SOLUTION PREP PVP 2OZ (MISCELLANEOUS) ×3 IMPLANT
STAPLER CANNULA SEAL DVNC XI (STAPLE) IMPLANT
STAPLER CANNULA SEAL XI (STAPLE) ×3
SURGILUBE 2OZ TUBE FLIPTOP (MISCELLANEOUS) ×3 IMPLANT
SUT MNCRL 4-0 (SUTURE) ×6
SUT MNCRL 4-0 27XMFL (SUTURE) ×6
SUT VIC AB 0 CT2 27 (SUTURE) ×6 IMPLANT
SUT VLOC 90 2/L VL 12 GS22 (SUTURE) IMPLANT
SUTURE MNCRL 4-0 27XMF (SUTURE) ×3 IMPLANT
TAPE TRANSPORE STRL 2 31045 (GAUZE/BANDAGES/DRESSINGS) IMPLANT
WATER STERILE IRR 500ML POUR (IV SOLUTION) ×3 IMPLANT

## 2022-05-10 NOTE — Op Note (Signed)
Tina Hubbard PROCEDURE DATE: 05/10/2022  PREOPERATIVE DIAGNOSIS: Complex adnexal mass POSTOPERATIVE DIAGNOSIS: The same, right side. Pelvic adhesive disease PROCEDURE:  XI ROBOTIC ASSISTED BILATERAL SALPINGO OOPHORECTOMY with PELVIC WASHINGS; LAPAROSCOPIC LYSIS OF ADHESION: [id number] (CPT) LYSIS OF ADHESION: [id number] CYSTOSCOPY: 52000 (CPT)  SURGEON:  Dr. Benjaman Kindler ASSISTANT: CST and RNFA Anesthesiologist:  Anesthesiologist: Ilene Qua, MD CRNA: Esaw Grandchild, CRNA; Otho Perl, CRNA  INDICATIONS: 77 y.o. F  here for definitive surgical management secondary to the indications listed under preoperative diagnoses; please see preoperative note for further details.  Risks of surgery were discussed with the patient including but not limited to: bleeding which may require transfusion or reoperation; infection which may require antibiotics; injury to bowel, bladder, ureters or other surrounding organs; need for additional procedures; thromboembolic phenomenon, incisional problems and other postoperative/anesthesia complications. Written informed consent was obtained.    FINDINGS: Normal external genitalia and vaginal cuff.  Significant pelvic adhesive disease, where the bowels were well adhesed to the bilateral ovarian fossa and vaginal cuff.  Large right-sided adnexal mass, multiloculated, with a normal left tube and ovary.   ANESTHESIA:    General INTRAVENOUS FLUIDS:600  ml ESTIMATED BLOOD LOSS:10 ml URINE OUTPUT: 100 ml  SPECIMENS: Bilateral ovaries and fallopian tubes COMPLICATIONS: None immediate  PROCEDURE IN DETAIL:  The patient had sequential compression devices applied to her lower extremities while in the preoperative area.  She was then taken to the operating room where general anesthesia was administered and was found to be adequate.  She was placed in the dorsal lithotomy position, and was prepped and draped in a sterile manner.  A formal time out was performed with  all team members present and in agreement.  An sponge stick was placed at this time.  A Foley catheter was inserted into her bladder and attached to constant drainage.   Attention was turned to the abdomen where an umbilical incision was made with the scalpel.  After infiltration of local anesthetic at the proposed trocar sites, an 8 mm incision was created at the umbilicus, and an AirSeal 58m was placed under direct visualization. Pneumoperitoneum was created to a pressure of 15 mm Hg. The camera was placed and the abdomin surveyed, noting intact bowel below the site of entry. A survey of the pelvis and upper abdomen revealed the above findings. One right and one left lateral 8-mm robotic ports were placed under direct visualization.  The patient was placed in deepTrendelenburg and the bowel was displaced up into the upper abdomen. The robot was left side docked. The instruments were placed under direct visualization.   Pelvic washings were taken.  Significant lysis of adhesions was undertaken to reveal the left and right ovarian fossa.  The ureters were never visualized throughout this case, despite being able to see the lateral pelvic sidewalls.  The decision was made to proceed with cystoscopy for this reason alone.  After removing significant bowel adhesions to the left IP, the left tube and ovary were excised from the pelvic sidewall.  They were mostly retroperitoneal, and the care was taken not to involve the ureter or to cauterize near it.  Attention was turned to the right IP ligament. This was fulgurated and ligated, freeing the ovaries and fallopian tubes from the pelvic sidewall. The ovaries placed in an Endocatch bag and were removed intact through the umbilical port, which was increased to fit a 172mbag.  Overall excellent hemostasis was noted.    Attention was returned to the abdomen.  The abdominal pressure was reduced and hemostasis was confirmed.   Arista placed.  The 34m port  fascia was closed with a vertical mattress with 0-Vicryl, using the cone closure system.  Cystoscopy revealed a normal bladder with excellent effluxing ureteral orifices.  All trocars were removed under direct visualization, and the abdomen was desufflated.  The fascial incision of the umbilicus was closed with a 0 Vicryl figure of eight stitch.  All skin incisions were closed with 4-0 Vicryl subcuticular stitches and Dermabond. The patient tolerated the procedures well.  All instruments, needles, and sponge counts were correct x 2. The patient was taken to the recovery room awake, extubated and in stable condition.

## 2022-05-10 NOTE — Progress Notes (Signed)
Dr. Erenest Rasher: anesthesia made aware of "hoarseness"  Instructed to drink plenty of fluids, if no improvement in 24-36hrs, please call Dr. Leafy Ro and she will touch base with your provider if a work up needs started, patient verbalizes understanding.

## 2022-05-10 NOTE — Anesthesia Procedure Notes (Signed)
Procedure Name: Intubation Date/Time: 05/10/2022 11:16 AM  Performed by: Otho Perl, CRNAPre-anesthesia Checklist: Patient identified, Patient being monitored, Timeout performed, Emergency Drugs available and Suction available Patient Re-evaluated:Patient Re-evaluated prior to induction Oxygen Delivery Method: Circle system utilized Preoxygenation: Pre-oxygenation with 100% oxygen Induction Type: IV induction Ventilation: Mask ventilation without difficulty Laryngoscope Size: 3 and McGraph Grade View: Grade I Tube type: Oral Tube size: 7.0 mm Number of attempts: 1 Airway Equipment and Method: Stylet Placement Confirmation: ETT inserted through vocal cords under direct vision, positive ETCO2 and breath sounds checked- equal and bilateral Secured at: 21 cm Tube secured with: Tape Dental Injury: Teeth and Oropharynx as per pre-operative assessment

## 2022-05-10 NOTE — Anesthesia Preprocedure Evaluation (Addendum)
Anesthesia Evaluation  Patient identified by MRN, date of birth, ID band Patient awake    Reviewed: Allergy & Precautions, NPO status , Patient's Chart, lab work & pertinent test results  History of Anesthesia Complications Negative for: history of anesthetic complications  Airway Mallampati: III  TM Distance: >3 FB Neck ROM: full    Dental  (+) Poor Dentition, Chipped Multiple front cracked teeth :   Pulmonary neg pulmonary ROS   Pulmonary exam normal        Cardiovascular hypertension, On Medications + CAD  Normal cardiovascular exam     Neuro/Psych  PSYCHIATRIC DISORDERS Anxiety Depression    Ulnar neuropathy    Neuromuscular disease (back pain with numbness and tingling)    GI/Hepatic Neg liver ROS,GERD  Medicated,,  Endo/Other  diabetes (diet controlled), Well Controlled    Renal/GU      Musculoskeletal  (+) Arthritis ,    Abdominal   Peds  Hematology  (+) Blood dyscrasia, anemia   Anesthesia Other Findings Past Medical History: No date: Adenoma of colon No date: Adnexal mass No date: Anxiety No date: Arthritis     Comment:  Psoriatic 07/16/2019: Cystoid macular edema of right eye No date: DDD (degenerative disc disease), lumbar No date: Depression No date: Detached retina     Comment:  has a sown in oil bubble in eye since Jan 17th No date: DM (diabetes mellitus), type 2 ([place])     Comment:  last a1c 7.0 01-19-22-diet controlled no meds No date: GERD (gastroesophageal reflux disease) No date: Hyperlipidemia No date: Hypertension No date: Left leg pain     Comment:  occasional numbness, R/T "slipped disc" above fusion No date: Osteoarthritis No date: Psoriatic arthritis ([place]) 07/16/2019: Right posterior capsular opacification  Past Surgical History: No date: ABDOMINAL HYSTERECTOMY No date: ANGIOPLASTY No date: APPENDECTOMY 2015: BACK SURGERY     Comment:  [place] No date: BILATERAL CARPAL TUNNEL RELEASE; Bilateral yrs ago: BREAST BIOPSY; Left     Comment:  EXCISIONAL X 2 - NEG 02/10/2020: BREAST BIOPSY; Right     Comment:  [place] Bx, vision clip, neg 08/24/2020: BREAST BIOPSY; Right     Comment:  Stereo bx, x xlip, path pending 02/27/2018: CARPAL TUNNEL RELEASE; Left     Comment:  Procedure: CARPAL TUNNEL RELEASE;  Surgeon: Corky Mull, MD;  Location: [place];  Service:               Orthopedics;  Laterality: Left; No date: CATARACT EXTRACTION; Bilateral     Comment:  Dr. Talbert Forest No date: CHOLECYSTECTOMY No date: COLONOSCOPY 01/10/2017: COLONOSCOPY WITH PROPOFOL; N/A     Comment:  Procedure: COLONOSCOPY WITH PROPOFOL;  Surgeon: Manya Silvas, MD;  Location[facility] ENDOSCOPY;  Service:               Endoscopy;  Laterality: N/A; No date: EYE SURGERY; Bilateral     Comment:  Cataract Extraction with IOL No date: FLEXIBLE SIGMOIDOSCOPY 2003: JOINT REPLACEMENT; Bilateral     Comment:  TOTAL KNEE REPLACEMENTS DR CALIFF [facility] No date: LUMBAR LAMINECTOMY 09/04/2018: POSTERIOR LUMBAR FUSION 4 LEVEL; N/A     Comment:  Procedure: L3-4 XLIF, L3-S1 PSF (MIS);  Surgeon:               Meade Maw, MD;  Location: [facility]  ORS;  Service:               Neurosurgery;  Laterality: N/A; No date: RETINAL DETACHMENT SURGERY 09/19/2016: SHOULDER ARTHROSCOPY WITH OPEN ROTATOR CUFF REPAIR; Right     Comment:  Procedure: SHOULDER ARTHROSCOPY WITH MINI OPEN ROTATOR               CUFF REPAIR;  Surgeon: Corky Mull, MD;  Location: [facility]              ORS;  Service: Orthopedics;  Laterality: Right;  Biceps               tenolysis 02/27/2018: TENNIS ELBOW RELEASE/NIRSCHEL PROCEDURE; Left     Comment:  Procedure: SUBCUTANEOUS ANTERIOR TRANSPOSITION OF [place] AT [place];  Surgeon: Corky Mull, MD;                Location: [place];  Service: Orthopedics;                Laterality:  Left; No date: TRIGGER FINGER RELEASE 02/21/2017: ULNAR NERVE TRANSPOSITION; Left     Comment:  Procedure: ULNAR NERVE DECOMPRESSION WRIST GUYON'S               CANAL;  Surgeon: Corky Mull, MD;  Location: [place];  Service: Orthopedics;  Laterality: Left;  BMI    Body Mass Index: 28.90 kg/m      Reproductive/Obstetrics negative OB ROS                             Anesthesia Physical Anesthesia Plan  ASA: 2  Anesthesia Plan: General ETT   Post-op Pain Management: Toradol IV (intra-op)* and Ketamine IV*   Induction: Intravenous  PONV Risk Score and Plan: Ondansetron, Dexamethasone, Midazolam and Treatment may vary due to age or medical condition  Airway Management Planned: Oral ETT  Additional Equipment:   Intra-op Plan:   Post-operative Plan: Extubation in OR  Informed Consent: I have reviewed the patients History and Physical, chart, labs and discussed the procedure including the risks, benefits and alternatives for the proposed anesthesia with the patient or authorized representative who has indicated his/her understanding and acceptance.     Dental Advisory Given  Plan Discussed with: Anesthesiologist, CRNA and Surgeon  Anesthesia Plan Comments: (Patient consented for risks of anesthesia including but not limited to:  - adverse reactions to medications - damage to eyes, teeth, lips or other oral mucosa - nerve damage due to positioning  - sore throat or hoarseness - Damage to heart, brain, nerves, lungs, other parts of body or loss of life  Patient voiced understanding.)        Anesthesia Quick Evaluation

## 2022-05-10 NOTE — Discharge Instructions (Addendum)
Laparoscopic Ovarian Surgery Discharge Instructions  For the next three days, take ibuprofen and acetaminophen on a schedule, every 8 hours. You can take them together or you can intersperse them, and take one every four hours. I also gave you gabapentin for nighttime, to help you sleep and also to control pain. Take gabapentin medicines at night for at least the next 3 nights. You also have a narcotic, oxycodone, to take as needed if the above medicines don't help.  Postop constipation is a major cause of pain. Stay well hydrated, walk as you tolerate, and take over the counter senna as well as stool softeners if you need them.   RISKS AND COMPLICATIONS  Infection. Bleeding. Injury to surrounding organs. Anesthetic side effects.   PROCEDURE  You may be given a medicine to help you relax (sedative) before the procedure. You will be given a medicine to make you sleep (general anesthetic) during the procedure. A tube will be put down your throat to help your breath while under general anesthesia. Several small cuts (incisions) are made in the lower abdominal area and one incision is made near the belly button. Your abdominal area will be inflated with a safe gas (carbon dioxide). This helps give the surgeon room to operate, visualize, and helps the surgeon avoid other organs. A thin, lighted tube (laparoscope) with a camera attached is inserted into your abdomen through the incision near the belly button. Other small instruments may also be inserted through other abdominal incisions. The ovary is located and are removed. After the ovary is removed, the gas is released from the abdomen. The incisions will be closed with stitches (sutures), and Dermabond. A bandage may be placed over the incisions.  AFTER THE PROCEDURE  You will also have some mild abdominal discomfort for 3-7 days. You will be given pain medicine to ease any discomfort. As long as there are no problems, you may be allowed to  go home. Someone will need to drive you home and be with you for at least 24 hours once home. You may have some mild discomfort in the throat. This is from the tube placed in your throat while you were sleeping. You may experience discomfort in the shoulder area from some trapped air between the liver and diaphragm. This sensation is normal and will slowly go away on its own.  HOME CARE INSTRUCTIONS  Take all medicines as directed. Only take over-the-counter or prescription medicines for pain, discomfort, or fever as directed by your caregiver. Resume daily activities as directed. Showers are preferred over baths for 2 weeks. You may resume sexual activities in 1 week or as you feel you would like to. Do not drive while taking narcotics.  SEEK MEDICAL CARE IF: . There is increasing abdominal pain. You feel lightheaded or faint. You have the chills. You have an oral temperature above 102 F (38.9 C). There is pus-like (purulent) drainage from any of the wounds. You are unable to pass gas or have a bowel movement. You feel sick to your stomach (nauseous) or throw up (vomit) and can't control it with your medicines.  MAKE SURE YOU:  Understand these instructions. Will watch your condition. Will get help right away if you are not doing well or get worse.  ExitCare Patient Information 2013 [place].    Here is a helpful article from the website [employer], regarding constipation  Here are reasons why constipation occurs after surgery: 1) During the operation and in the recovery room, most people  are given opioid pain medication, primarily through an IV, to treat moderate or severe pain. Intravenous opioids include morphine, Dilaudid and fentanyl. After surgery, patients are often prescribed opioid pain medication to take by mouth at home, including codeine, Vicodin, Norco, and Percocet. All of these medications cause constipation by slowing down the movement of your  intestine. 2) Changes in your diet before surgery can be another culprit. It is common to get specific instructions to change how you normally eat or drink before your surgery, like only having liquids the day before or not having anything to eat or drink after midnight the night before surgery. For this reason, temporary dehydration may occur. This, along with not eating or only having liquids, means that you are getting less fiber than usual. Both these factors contribute to constipation. 3) Changes in your diet after surgery can also contribute to the problem. Although many people don't have dietary restrictions after operations, being under anesthesia can make you lose your appetite for several hours and maybe even days. Some people can even have nausea or vomiting. Not eating or drinking normally means that you are not getting enough fiber and you can get dehydrated, both leading to constipation. 4) Lying in a bed more than usual--which happens before, during and after surgery--combined with the medications and diet changes, all work together to slow down your colon and make your poop turn to rock.  No one likes to be constipated.  Let's face it, it's not a pleasant feeling when you don't poop for days, then strain on the toilet to finally pass something large enough to cause damage. An ounce of prevention is worth a pound of cure, so: Assume you will be constipated. Plan and prepare accordingly. Post-surgery is one of those unique situations where the temporary use of laxatives can make a world of difference. Always consult with your doctor, and recognize that if you wait several days after surgery to take a laxative, the constipation might be too severe for these over-the-counter options. It is always important to discuss all medications you plan on taking with your doctor. Ask your doctor if you can start the laxative immediately after surgery. *  Here are go-to post-surgery laxatives: Senna:  Senna is an herb that acts as a "stimulant laxative," meaning it increases the activity of the intestine to cause you to have a bowel movement. It comes in many forms, but senna pills are easy to take and are sold over the counter at almost all pharmacies. Since opioid pain medications slow down the activity of the intestine, it makes sense to take a medication to help reverse that side effect. Long-term use of a stimulant laxative is not a good idea since it can make your colon "lazy" and not function properly; however, temporary use immediately after surgery is acceptable. In general, if you are able to eat a normal diet, taking senna soon after surgery works the best. Senna usually works within hours to produce a bowel movement, but this is less predictable when you are taking different medications after surgery. Try not to wait several days to start taking senna, as often it is too late by then. Just like with all medications or supplements, check with your doctor before starting new treatment.   Magnesium: Magnesium is an important mineral that our body needs. We get magnesium from some foods that we eat, especially foods that are high in fiber such as broccoli, almonds and whole grains. There are also magnesium-based medications used  to treat constipation including milk of magnesia (magnesium hydroxide), magnesium citrate and magnesium oxide. They work by drawing water into the intestine, putting it into the class of "osmotic" laxatives. Magnesium products in low doses appear to be safe, but if taken in very large doses, can lead to problems such as irregular heartbeat, low blood pressure and even death. It can also affect other medications you might be taking, therefore it is important to discuss using magnesium with your physician and pharmacist before initiating therapy. Most over-the-counter magnesium laxatives work very well to help with the constipation related to surgery, but sometimes they work too  well and lead to diarrhea. Make sure you are somewhere with easy access to a bathroom, just in case.   Bisacodyl: Bisacodyl (generic name) is sold under brand names such as Dulcolax. Much like senna, it is a "stimulant laxative," meaning it makes your intestines move more quickly to push out the stool. This is another good choice to start taking as soon as your doctor says you can take a laxative after surgery. It comes in pill form and as a suppository, which is a good choice for people who cannot or are not allowed to swallow pills. Studies have shown that it works as a laxative, but like most of these medications, you should use this on a short-term basis only.   Enema: Enemas strike fear in many people, but FEAR NOT! It's nowhere near as big a deal as you may think. An enema is just a way to get some liquid into your rectum by placing a specially designed device through your anus. If you have never done one, it might seem like a painful, unpleasant, uncomfortable, complicated and lengthy procedure. But in reality, it's simple, takes just a few seconds and is highly effective. The small ready-made bottles you buy at the pharmacy are much easier than the hose/large rubber container type. Those recommended positions illustrated in some instructions are generally not necessary to place the enema. It's very similar to the insertion of a tampon, requiring a slight squat. Some extra lubrication on the enema's tip (or on your anus) will make it a breeze. In certain cases, there is no substitute for a good enema. For example, if someone has not pooped for a few days, the beginning of the poop waiting to come out can become rock hard. Passing that hard stool can lead to much pain and problems like anal fissures. Inserting a little liquid to break up the rock-hard stool will help make its passage much easier. Enemas come with different liquids. Most come with saline, but there are also mineral oil options. You can  also use warm water in the reusable enema containers. They all work. But since saline can sometimes be irritating, so try a mineral oil or water enema instead.  Here are commonly recommended constipation medications that do not work well for post-surgery constipation: Docusate: Docusate (generic name) most commonly referred to as Colace (brand name) is not really a laxative, but is classified as a stool softener. Although this medication is commonly prescribed, it is not recommended for several reasons: 1) there is no good medical evidence that it works 2) even if it has an effect, which is very questionable, it is minimal and cannot combat the intestinal slowing caused by the opioid medications. Skip docusate to save money and space in your pillbox for something more effective.  PEG: Miralax (brand name) is basically a chemical called polyethylene glycol (PEG) and it has  gained tremendous popularity as a laxative. This product is an "osmotic laxative" meaning it works by pulling water into the stool, making it softer. This is very similar to the action of natural fiber in foods and supplements. Therefore, the effect seen by this medication is not immediate, causing a bowel movement in a day or more. Is this medication strong enough to battle the constipation related to having an operation? Maybe for some people not prone to constipation. But for most people, other laxatives are better to prevent constipation after surgery. AMBULATORY SURGERY  DISCHARGE INSTRUCTIONS   The drugs that you were given will stay in your system until tomorrow so for the next 24 hours you should not:  Drive an automobile Make any legal decisions Drink any alcoholic beverage   You may resume regular meals tomorrow.  Today it is better to start with liquids and gradually work up to solid foods.  You may eat anything you prefer, but it is better to start with liquids, then soup and crackers, and gradually work up to solid  foods.   Please notify your doctor immediately if you have any unusual bleeding, trouble breathing, redness and pain at the surgery site, drainage, fever, or pain not relieved by medication.    Additional Instructions:  Please contact your physician with any problems or Same Day Surgery at [phone], Monday through Friday 6 am to 4 pm, or [place] at [facility] number at [phone].

## 2022-05-10 NOTE — Transfer of Care (Signed)
Immediate Anesthesia Transfer of Care Note  Patient: Tina Hubbard  Procedure(s) Performed: XI ROBOTIC ASSISTED BILATERAL SALPINGO OOPHORECTOMY with PELVIC WASHINGS; LAPAROSCOPIC LYSIS OF ADHESION (Bilateral) LYSIS OF ADHESION (Pelvis) CYSTOSCOPY  Patient Location: PACU  Anesthesia Type:General  Level of Consciousness: drowsy  Airway & Oxygen Therapy: Patient Spontanous Breathing and Patient connected to face mask oxygen  Post-op Assessment: Report given to RN and Post -op Vital signs reviewed and stable  Post vital signs: Reviewed  Last Vitals:  Vitals Value Taken Time  BP 116/61 05/10/22 1335  Temp 98 F   Pulse 94 05/10/22 1339  Resp 20 05/10/22 1339  SpO2 100 % 05/10/22 1339  Vitals shown include unvalidated device data.  Last Pain:  Vitals:   05/10/22 0954  TempSrc: Oral  PainSc: 0-No pain         Complications: No notable events documented.

## 2022-05-10 NOTE — Interval H&P Note (Signed)
History and Physical Interval Note:  05/10/2022 10:50 AM  Tina Hubbard  has presented today for surgery, with the diagnosis of complex ovarian cyst.  The various methods of treatment have been discussed with the patient and family. After consideration of risks, benefits and other options for treatment, the patient has consented to  Procedure(s) with comments: XI ROBOTIC [place] with PELVIC WASHINGS; [place] (Bilateral) - RNFA as a surgical intervention.  The patient's history has been reviewed, patient examined, no change in status, stable for surgery.  I have reviewed the patient's chart and labs.  Questions were answered to the patient's satisfaction.     Benjaman Kindler

## 2022-05-11 ENCOUNTER — Encounter: Payer: Self-pay | Admitting: Obstetrics and Gynecology

## 2022-05-11 LAB — SURGICAL PATHOLOGY

## 2022-05-11 LAB — CYTOLOGY - NON PAP

## 2022-05-11 NOTE — Anesthesia Postprocedure Evaluation (Signed)
Anesthesia Post Note  Patient: Tina Hubbard  Procedure(s) Performed: XI ROBOTIC ASSISTED BILATERAL SALPINGO OOPHORECTOMY with PELVIC WASHINGS; LAPAROSCOPIC LYSIS OF ADHESION (Bilateral) LYSIS OF ADHESION (Pelvis) CYSTOSCOPY  Patient location during evaluation: PACU Anesthesia Type: General Level of consciousness: awake and alert Pain management: pain level controlled Vital Signs Assessment: post-procedure vital signs reviewed and stable Respiratory status: spontaneous breathing, nonlabored ventilation, respiratory function stable and patient connected to nasal cannula oxygen Cardiovascular status: blood pressure returned to baseline and stable Postop Assessment: no apparent nausea or vomiting Anesthetic complications: no   No notable events documented.   Last Vitals:  Vitals:   05/10/22 1445 05/10/22 1500  BP: (!) 117/57 125/69  Pulse: 89 87  Resp: 18 20  Temp:  37.1 C  SpO2: 96% 95%    Last Pain:  Vitals:   05/10/22 1500  TempSrc: Temporal  PainSc: 1                  Ilene Qua

## 2022-05-30 ENCOUNTER — Encounter (INDEPENDENT_AMBULATORY_CARE_PROVIDER_SITE_OTHER): Payer: Medicare Other | Admitting: Ophthalmology

## 2022-06-15 ENCOUNTER — Other Ambulatory Visit: Payer: Self-pay | Admitting: Family Medicine

## 2022-06-15 DIAGNOSIS — M5412 Radiculopathy, cervical region: Secondary | ICD-10-CM

## 2022-07-15 ENCOUNTER — Ambulatory Visit
Admission: RE | Admit: 2022-07-15 | Discharge: 2022-07-15 | Disposition: A | Discharge status: Home / Self Care | Payer: Medicare Other | Source: Ambulatory Visit | Attending: Family Medicine | Admitting: Family Medicine

## 2022-07-15 DIAGNOSIS — M5412 Radiculopathy, cervical region: Secondary | ICD-10-CM

## 2022-07-17 ENCOUNTER — Other Ambulatory Visit: Payer: Self-pay | Admitting: Family Medicine

## 2022-07-17 DIAGNOSIS — Q349 Congenital malformation of respiratory system, unspecified: Secondary | ICD-10-CM

## 2022-07-24 ENCOUNTER — Ambulatory Visit
Admission: RE | Admit: 2022-07-24 | Discharge: 2022-07-24 | Disposition: A | Discharge status: Home / Self Care | Payer: Medicare Other | Source: Ambulatory Visit | Attending: Family Medicine | Admitting: Family Medicine

## 2022-07-24 DIAGNOSIS — Q349 Congenital malformation of respiratory system, unspecified: Secondary | ICD-10-CM | POA: Insufficient documentation

## 2022-07-24 DIAGNOSIS — IMO0002 Reserved for concepts with insufficient information to code with codable children: Secondary | ICD-10-CM

## 2022-07-24 MED ORDER — GADOBUTROL 1 MMOL/ML IV SOLN
7.0000 mL | Freq: Once | INTRAVENOUS | Status: AC | PRN
  Administered 2022-07-24: 7 mL via INTRAVENOUS

## 2022-08-07 NOTE — Progress Notes (Unsigned)
Referring Physician:  Denton Lank, FNP [address] [place],  [place] [address]  Primary Physician:  Marisue Ivan, MD  History of Present Illness: 08/08/2022 [name] is here today with a chief complaint of neck pain, imbalance, numbness and tingling in her hands, loss of grip strength.  She is also describing second pain which extends in the left side of her scalp.  She has been dropping things and having balance issues over the last 6 to 7 months.  She has pain into her left trapezius.  Her pain can be as bad as 10 out of 10.  Lifting and lying in bed make it worse.  Her pain is constant.  Bowel/Bladder Dysfunction: none  Conservative measures:  Physical therapy:  denies has not participated in  Multimodal medical therapy including regular antiinflammatories:  ibuprofen, gabapentin, celebrex  Injections: denies has not received epidural steroid injections for her neck?  Past Surgery:  12/27/11: Lumbar Laminectomy by Dr. Rise Mu 11/20/14: L4-5 TLIF by Dr. Christene Lye  09/04/18: L3-4 XLIF, L3-S1 PSF   Tina Hubbard has symptoms of cervical myelopathy.  The symptoms are causing a significant impact on the patient's life.   I have utilized the care everywhere function in epic to review the outside records available from external health systems.  Review of Systems:  A 10 point review of systems is negative, except for the pertinent positives and negatives detailed in the HPI.  Past Medical History: Past Medical History:  Diagnosis Date   Adenoma of colon    Adnexal mass    Anxiety    Arthritis    Psoriatic   Cystoid macular edema of right eye 07/16/2019   DDD (degenerative disc disease), lumbar    Depression    Detached retina    has a sown in oil bubble in eye since Jan 17th   DM (diabetes mellitus), type 2 (HCC)    last a1c 7.0 01-19-22-diet controlled no meds   GERD (gastroesophageal reflux disease)    Hyperlipidemia    Hypertension     Left leg pain    occasional numbness, R/T "slipped disc" above fusion   Osteoarthritis    Psoriatic arthritis (HCC)    Right posterior capsular opacification 07/16/2019    Past Surgical History: Past Surgical History:  Procedure Laterality Date   ABDOMINAL HYSTERECTOMY     ANGIOPLASTY     APPENDECTOMY     BACK SURGERY  2015   SPINAL FUSION [facility]   BILATERAL CARPAL TUNNEL RELEASE Bilateral    BREAST BIOPSY Left yrs ago   EXCISIONAL X 2 - NEG   BREAST BIOPSY Right 02/10/2020   [place] Bx, vision clip, neg   BREAST BIOPSY Right 08/24/2020   Stereo bx, x xlip, path pending   CARPAL TUNNEL RELEASE Left 02/27/2018   Procedure: CARPAL TUNNEL RELEASE;  Surgeon: Christena Flake, MD;  Location: [facility];  Service: Orthopedics;  Laterality: Left;   CATARACT EXTRACTION Bilateral    Dr. Vonna Kotyk   CHOLECYSTECTOMY     COLONOSCOPY     COLONOSCOPY WITH PROPOFOL N/A 01/10/2017   Procedure: COLONOSCOPY WITH PROPOFOL;  Surgeon: Scot Jun, MD;  Location: [facility];  Service: Endoscopy;  Laterality: N/A;   CYSTOSCOPY  05/10/2022   Procedure: CYSTOSCOPY;  Surgeon: Christeen Douglas, MD;  Location: [facility] OR[facility];  Service: Gynecology;;   EYE SURGERY Bilateral    Cataract Extraction with IOL   FLEXIBLE SIGMOIDOSCOPY     JOINT REPLACEMENT Bilateral  2003   TOTAL KNEE REPLACEMENTS DR CALIFF [facility]   LUMBAR LAMINECTOMY     LYSIS OF ADHESION N/A 05/10/2022   Procedure: LYSIS OF ADHESION;  Surgeon: Christeen Douglas, MD;  Location: [facility];  Service: Gynecology;  Laterality: N/A;   POSTERIOR LUMBAR FUSION 4 LEVEL N/A 09/04/2018   Procedure: L3-4 XLIF, L3-S1 PSF (MIS);  Surgeon: Venetia Night, MD;  Location: [facility];  Service: Neurosurgery;  Laterality: N/A;   RETINAL DETACHMENT SURGERY     ROBOTIC ASSISTED BILATERAL SALPINGO OOPHERECTOMY Bilateral 05/10/2022   Procedure: XI ROBOTIC ASSISTED BILATERAL SALPINGO OOPHORECTOMY with PELVIC WASHINGS; LAPAROSCOPIC LYSIS OF ADHESION;   Surgeon: Christeen Douglas, MD;  Location: [facility];  Service: Gynecology;  Laterality: Bilateral;  RNFA   SHOULDER ARTHROSCOPY WITH OPEN ROTATOR CUFF REPAIR Right 09/19/2016   Procedure: SHOULDER ARTHROSCOPY WITH MINI OPEN ROTATOR CUFF REPAIR;  Surgeon: Christena Flake, MD;  Location: [facility];  Service: Orthopedics;  Laterality: Right;  Biceps tenolysis   TENNIS ELBOW RELEASE/NIRSCHEL PROCEDURE Left 02/27/2018   Procedure: SUBCUTANEOUS ANTERIOR TRANSPOSITION OF TH EULNAR NERVE AT THE ELBOW;  Surgeon: Christena Flake, MD;  Location: [facility];  Service: Orthopedics;  Laterality: Left;   TRIGGER FINGER RELEASE     ULNAR NERVE TRANSPOSITION Left 02/21/2017   Procedure: ULNAR NERVE DECOMPRESSION WRIST GUYON'S CANAL;  Surgeon: Christena Flake, MD;  Location: [facility];  Service: Orthopedics;  Laterality: Left;    Allergies: Allergies as of 08/08/2022 - Review Complete 08/08/2022  Allergen Reaction Noted   Hydrocodone Nausea And Vomiting 02/13/2017   Oxycodone Other (See Comments) 12/21/2014    Medications:  Current Outpatient Medications:    amLODipine (NORVASC) 5 MG tablet, Take 5 mg by mouth every morning., Disp: , Rfl:    atorvastatin (LIPITOR) 20 MG tablet, Take 20 mg by mouth every morning., Disp: , Rfl:    busPIRone (BUSPAR) 10 MG tablet, Take 10 mg by mouth 2 (two) times daily., Disp: , Rfl:    celecoxib (CELEBREX) 200 MG capsule, Take 200 mg by mouth 2 (two) times daily., Disp: , Rfl:    docusate sodium (COLACE) 100 MG capsule, Take 1 capsule (100 mg total) by mouth 2 (two) times daily. To keep stools soft, Disp: 30 capsule, Rfl: 0   DULoxetine (CYMBALTA) 60 MG capsule, Take 60 mg by mouth every morning., Disp: , Rfl: 4   estradiol (ESTRACE) 1 MG tablet, Take 1 mg by mouth every morning., Disp: , Rfl:    etanercept (ENBREL SURECLICK) 50 MG/ML injection, Inject 50 mg into the skin once a week. Wednesdays, Disp: , Rfl:    HYDROmorphone (DILAUDID) 2 MG tablet, Take 1 tablet  (2 mg total) by mouth every 4 (four) hours as needed for severe pain., Disp: 10 tablet, Rfl: 0   meloxicam (MOBIC) 15 MG tablet, Take by mouth., Disp: , Rfl:    methocarbamol (ROBAXIN) 500 MG tablet, Take by mouth., Disp: , Rfl:    omeprazole (PRILOSEC) 20 MG capsule, Take 20 mg by mouth every morning., Disp: , Rfl:    valsartan-hydrochlorothiazide (DIOVAN-HCT) 320-12.5 MG tablet, Take 1 tablet by mouth every morning., Disp: , Rfl:    gabapentin (NEURONTIN) 300 MG capsule, Take 300-600 mg by mouth See admin instructions. Take 300 mg by mouth in the morning, and 600 mg at bedtime, Disp: , Rfl:    traZODone (DESYREL) 50 MG tablet, Take 50 mg by mouth at bedtime. , Disp: , Rfl:   Social History: Social History   Tobacco Use  Smoking status: Never   Smokeless tobacco: Never  Vaping Use   Vaping Use: Never used  Substance Use Topics   Alcohol use: Yes    Comment: glass of wine per month   Drug use: No    Family Medical History: Family History  Problem Relation Age of Onset   Uterine cancer Mother 62   Diabetes Mother    Diabetes Sister    Breast cancer Neg Hx     Physical Examination: Vitals:   08/08/22 1503  BP: 130/72    General: Patient is in no apparent distress. Attention to examination is appropriate.  Neck:   Supple.  Full range of motion with discomfort on rotation to the left.  Respiratory: Patient is breathing without any difficulty.   NEUROLOGICAL:     Awake, alert, oriented to person, place, and time.  Speech is clear and fluent.   Cranial Nerves: Pupils equal round and reactive to light.  Facial tone is symmetric.  Facial sensation is symmetric. Shoulder shrug is symmetric. Tongue protrusion is midline.  There is no pronator drift.  Strength: Side Biceps Triceps Deltoid Interossei Grip Wrist Ext. Wrist Flex.  R 5 5 5 4 4 5 5   L 5 5 5 4 4 5 5    Side Iliopsoas Quads Hamstring PF DF EHL  R 5 5 5 5 5 5   L 5 5 5 5 5 5    Reflexes are 3+ and symmetric at  the biceps, triceps, brachioradialis, 2+patella and achilles.   Hoffman's is present on the left.   Bilateral upper and lower extremity sensation is intact to light touch.    No evidence of dysmetria noted.  Gait is wide-based and slow.     Medical Decision Making  Imaging: MR C spine 07/15/2022 IMPRESSION: 1. 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 2. 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 3. Moderate to severe bilateral foraminal stenosis at C4-5 and on the right at C5-6. 4. Partially imaged intradural extramedullary cystic structure posterior to the thoracic spine measuring 7 mm in AP diameter with mass effect upon the thoracic cord. 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 Recommend further evaluation with MRI of the thoracic spine with and without contrast.   These results will be called to the ordering clinician or representative by the Radiologist Assistant, and communication documented in the PACS or [employer].     Electronically Signed   By: Duanne Guess D.O.   On: 07/15/2022 11:51  I have personally reviewed the images and agree with the above interpretation.  Assessment and Plan: Tina Hubbard is a pleasant 77 y.o. female with clinical signs and symptoms of cervical myelopathy with severe stenosis from C4-C6.  There is no role for conservative management for cervical spondylotic myelopathy.  I recommended that she consider C4-6 anterior cervical discectomy and fusion.  I discussed the planned procedure at length with the patient, including the risks, benefits, alternatives, and indications. The risks discussed include but are not limited to bleeding, infection, need for reoperation, spinal fluid leak, stroke, vision loss, anesthetic complication, coma, paralysis, and even death. We also  discussed the possibility of post-operative dysphagia, vocal cord paralysis, and the risk of adjacent segment disease in the future. I also described in detail that improvement was not guaranteed.  The patient expressed understanding of these risks, and asked that we proceed with surgery. I  described the surgery in layman's terms, and gave ample opportunity for questions, which were answered to the best of my ability.   She also has what appears to be occipital neuralgia on the left side.  Will address that after her cervical stenosis is addressed.    Thank you for involving me in the care of this patient.      Joyel Chenette K. Myer Haff MD, [facility] Neurosurgery

## 2022-08-08 ENCOUNTER — Ambulatory Visit (INDEPENDENT_AMBULATORY_CARE_PROVIDER_SITE_OTHER): Payer: Medicare Other | Admitting: Neurosurgery

## 2022-08-08 ENCOUNTER — Encounter: Payer: Self-pay | Admitting: Neurosurgery

## 2022-08-08 VITALS — BP 130/72 | Ht 63.5 in | Wt 163.4 lb

## 2022-08-08 DIAGNOSIS — M5481 Occipital neuralgia: Secondary | ICD-10-CM | POA: Diagnosis not present

## 2022-08-08 DIAGNOSIS — G959 Disease of spinal cord, unspecified: Secondary | ICD-10-CM | POA: Diagnosis not present

## 2022-08-08 DIAGNOSIS — M4802 Spinal stenosis, cervical region: Secondary | ICD-10-CM | POA: Diagnosis not present

## 2022-08-08 NOTE — Patient Instructions (Signed)
Please see below for information in regards to your upcoming surgery:   Planned surgery: C4-6 anterior cervical discectomy and fusion   Surgery date: 09/25/22 - you will find out your arrival time the business day before your surgery.   Pre-op appointment at [facility] Pre-admit Testing: we will call you with a date/time for this. Pre-admit testing is located on the first floor of the [facility] building, [address] [facility] [address], [address]. Please bring all prescriptions in the original prescription bottles to your appointment, even if you have reviewed medications by phone with a pharmacy representative. Pre-op labs may be done at your pre-op appointment. You are not required to fast for these labs. Should you need to change your pre-op appointment, please call Pre-admit testing at [phone].    Surgical clearance: we will send a clearance form to Dr Burnadette Pop   Enbrel: hold 2 weeks before and 2 weeks after surgery    NSAIDS (Non-steroidal anti-inflammatory drugs): because you are having a fusion, no NSAIDS (such as ibuprofen, aleve, naproxen, meloxicam, diclofenac) for 3 months after surgery. Celebrex is an exception. Tylenol is ok because it is not an NSAID.     Because you are having a fusion: for appointments after your 2 week follow-up: please arrive at the [facility] outpatient imaging center ([address], [place]) or [employer] one hour prior to your appointment for x-rays. This applies to every appointment after your 2 week follow-up. Failure to do so may result in your appointment being rescheduled.    We can be reached by phone or mychart 8am-4pm, Monday-Friday. If you have any questions/concerns before or after surgery, you can reach [place] at [phone], or you can send a mychart message. If you have a concern after hours that cannot wait until normal business hours, you can call [phone] and ask to page the  neurosurgeon on call for [place].     Appointments/FMLA & disability paperwork: [employer]  Nurse: Royston Cowper  Medical assistants: Laurann Montana Physician Assistant's: Manning Charity & Drake Leach Surgeon: Venetia Night, MD

## 2022-08-28 ENCOUNTER — Other Ambulatory Visit: Payer: Self-pay

## 2022-08-28 DIAGNOSIS — Z01818 Encounter for other preprocedural examination: Secondary | ICD-10-CM

## 2022-08-29 ENCOUNTER — Other Ambulatory Visit: Payer: Self-pay | Admitting: Neurosurgery

## 2022-08-29 ENCOUNTER — Telehealth: Payer: Self-pay | Admitting: Neurosurgery

## 2022-08-29 MED ORDER — METHOCARBAMOL 500 MG PO TABS: 500.0000 mg | ORAL_TABLET | Freq: Two times a day (BID) | ORAL | 0 refills | Status: DC | PRN

## 2022-08-29 MED ORDER — MELOXICAM 15 MG PO TABS: 15.0000 mg | ORAL_TABLET | Freq: Every day | ORAL | 0 refills | Status: DC

## 2022-08-29 NOTE — Telephone Encounter (Signed)
C4-6 ACDF on 09/25/22  Patient is in the process of moving, that is why she had to push her surgery to July. She is having increased pain due to her moving process. Before seeing Dr.Yarbrough, she was being treated at [facility]. They had prescribed Meloxicam 15mg  once a day and methocarbamol 500mg  1 a day. She would them as needed.  These medications did help her. Can Dr.Yarbrough call in both please to get her through her moving process and to surgery.  [facility]

## 2022-08-29 NOTE — Telephone Encounter (Signed)
She was notified of the refills, she stopped taking celebrex awhile back.

## 2022-09-05 ENCOUNTER — Other Ambulatory Visit: Payer: Self-pay | Admitting: Neurosurgery

## 2022-09-12 ENCOUNTER — Other Ambulatory Visit: Payer: Self-pay

## 2022-09-12 ENCOUNTER — Encounter
Admission: RE | Admit: 2022-09-12 | Discharge: 2022-09-12 | Disposition: A | Discharge status: Home / Self Care | Payer: Medicare Other | Source: Ambulatory Visit | Attending: Neurosurgery | Admitting: Neurosurgery

## 2022-09-12 VITALS — BP 139/73 | HR 84 | Resp 16 | Ht 63.5 in | Wt 160.5 lb

## 2022-09-12 DIAGNOSIS — E119 Type 2 diabetes mellitus without complications: Secondary | ICD-10-CM | POA: Diagnosis not present

## 2022-09-12 DIAGNOSIS — Z01812 Encounter for preprocedural laboratory examination: Secondary | ICD-10-CM | POA: Diagnosis present

## 2022-09-12 HISTORY — DX: Headache, unspecified: R51.9

## 2022-09-12 LAB — TYPE AND SCREEN
ABO/RH(D): A POS
Antibody Screen: NEGATIVE

## 2022-09-12 LAB — SURGICAL PCR SCREEN
MRSA, PCR: NEGATIVE
Staphylococcus aureus: POSITIVE — AB

## 2022-09-12 NOTE — Patient Instructions (Addendum)
Your procedure is scheduled on: 09/25/22 - Monday Report to the Registration Desk on the 1st floor of the [facility]. To find out your arrival time, please call [phone] between 1PM - 3PM on: 09/22/22 - Friday If your arrival time is 6:00 am, do not arrive before that time as the [facility] entrance doors do not open until 6:00 am.  REMEMBER: Instructions that are not followed completely may result in serious medical risk, up to and including death; or upon the discretion of your surgeon and anesthesiologist your surgery may need to be rescheduled.  Do not eat food after midnight the night before surgery.  No gum chewing or hard candies.  You may however, drink CLEAR liquids up to 2 hours before you are scheduled to arrive for your surgery. Do not drink anything within 2 hours of your scheduled arrival time.  Clear liquids include: - water  - apple juice without pulp - gatorade (not RED colors) - black coffee or tea (Do NOT add milk or creamers to the coffee or tea) Do NOT drink anything that is not on this list.  Per Dr. Myer Haff, May continue  Anti-inflammatories (NSAIDS) such as Advil, Aleve, Ibuprofen, Motrin, Naproxen, Naprosyn and Aspirin based products such as Excedrin, Goody's Powder, BC Powder if needed. Do not take the morning of your surgery.  Stop ANY OVER THE COUNTER supplements until after surgery.  You may take Tylenol if needed for pain up until the day of surgery.  Continue taking all prescribed medications with the exception of the following:  etanercept (ENBREL SURECLICK) hold dose 2 weeks prior to your surgery, may resume 2 weeks after your surgery.  TAKE ONLY THESE MEDICATIONS THE MORNING OF SURGERY WITH A SIP OF WATER:  omeprazole (PRILOSEC) -(take one the night before and one on the morning of surgery - helps to prevent nausea after surgery.) amLODipine (NORVASC)  busPIRone (BUSPAR)  DULoxetine (CYMBALTA)  estradiol (ESTRACE)  gabapentin  (NEURONTIN)  HYDROmorphone (DILAUDID) if needed  No Alcohol for 24 hours before or after surgery.  No Smoking including e-cigarettes for 24 hours before surgery.  No chewable tobacco products for at least 6 hours before surgery.  No nicotine patches on the day of surgery.  Do not use any "recreational" drugs for at least a week (preferably 2 weeks) before your surgery.  Please be advised that the combination of cocaine and anesthesia may have negative outcomes, up to and including death. If you test positive for cocaine, your surgery will be cancelled.  On the morning of surgery brush your teeth with toothpaste and water, you may rinse your mouth with mouthwash if you wish. Do not swallow any toothpaste or mouthwash.  Contact lenses, hearing aids and dentures may not be worn into surgery.  Do not bring valuables to the hospital. [facility] is not responsible for any missing/lost belongings or valuables.   Notify your doctor if there is any change in your medical condition (cold, fever, infection).  Wear comfortable clothing (specific to your surgery type) to the hospital.  After surgery, you can help prevent lung complications by doing breathing exercises.  Take deep breaths and cough every 1-2 hours. Your doctor may order a device called an Incentive Spirometer to help you take deep breaths. When coughing or sneezing, hold a pillow firmly against your incision with both hands. This is called "splinting." Doing this helps protect your incision. It also decreases belly discomfort.  If you are being admitted to the hospital overnight,  leave your suitcase in the car. After surgery it may be brought to your room.  In case of increased patient census, it may be necessary for you, the patient, to continue your postoperative care in the Same Day Surgery department.  If you are being discharged the day of surgery, you will not be allowed to drive home. You will need a responsible individual  to drive you home and stay with you for 24 hours after surgery.   If you are taking public transportation, you will need to have a responsible individual with you.  Please call the Pre-admissions Testing Dept. at [phone] if you have any questions about these instructions.  Surgery Visitation Policy:  Patients having surgery or a procedure may have two visitors.  Children under the age of 38 must have an adult with them who is not the patient.  Inpatient Visitation:    Visiting hours are 7 a.m. to 8 p.m. Up to four visitors are allowed at one time in a patient room. The visitors may rotate out with other people during the day.  One visitor age 62 or older may stay with the patient overnight and must be in the room by 8 p.m.   Pre-operative 5 CHG Bath Instructions   You can play a key role in reducing the risk of infection after surgery. Your skin needs to be as free of germs as possible. You can reduce the number of germs on your skin by washing with CHG (chlorhexidine gluconate) soap before surgery. CHG is an antiseptic soap that kills germs and continues to kill germs even after washing.   DO NOT use if you have an allergy to chlorhexidine/CHG or antibacterial soaps. If your skin becomes reddened or irritated, stop using the CHG and notify one of our RNs at [phone].   Please shower with the CHG soap starting 4 days before surgery using the following schedule: 06/27, 06/28, 06/29, 06/30, 07/01.                Please keep in mind the following:   DO NOT shave, including legs and underarms, starting the day of your first shower.   You may shave your face at any point before/day of surgery.  Place clean sheets on your bed the day you start using CHG soap. Use a clean washcloth (not used since being washed) for each shower. DO NOT sleep with pets once you start using the CHG.   CHG Shower Instructions:  If you choose to wash your hair and private area, wash first with  your normal shampoo/soap.  After you use shampoo/soap, rinse your hair and body thoroughly to remove shampoo/soap residue.  Turn the water OFF and apply about 3 tablespoons (45 ml) of CHG soap to a CLEAN washcloth.  Apply CHG soap ONLY FROM YOUR NECK DOWN TO YOUR TOES (washing for 3-5 minutes)  DO NOT use CHG soap on face, private areas, open wounds, or sores.  Pay special attention to the area where your surgery is being performed.  If you are having back surgery, having someone wash your back for you may be helpful. Wait 2 minutes after CHG soap is applied, then you may rinse off the CHG soap.  Pat dry with a clean towel  Put on clean clothes/pajamas   If you choose to wear lotion, please use ONLY the CHG-compatible lotions on the back of this paper.     Additional instructions for the day of surgery: DO NOT APPLY any  lotions, deodorants, cologne, or perfumes.   Put on clean/comfortable clothes.  Brush your teeth.  Ask your nurse before applying any prescription medications to the skin.      CHG Compatible Lotions   Aveeno Moisturizing lotion  Cetaphil Moisturizing Cream  Cetaphil Moisturizing Lotion  Clairol Herbal Essence Moisturizing Lotion, Dry Skin  Clairol Herbal Essence Moisturizing Lotion, Extra Dry Skin  Clairol Herbal Essence Moisturizing Lotion, Normal Skin  Curel Age Defying Therapeutic Moisturizing Lotion with Alpha Hydroxy  Curel Extreme Care Body Lotion  Curel Soothing Hands Moisturizing Hand Lotion  Curel Therapeutic Moisturizing Cream, Fragrance-Free  Curel Therapeutic Moisturizing Lotion, Fragrance-Free  Curel Therapeutic Moisturizing Lotion, Original Formula  Eucerin Daily Replenishing Lotion  Eucerin Dry Skin Therapy Plus Alpha Hydroxy Crme  Eucerin Dry Skin Therapy Plus Alpha Hydroxy Lotion  Eucerin Original Crme  Eucerin Original Lotion  Eucerin Plus Crme Eucerin Plus Lotion  Eucerin TriLipid Replenishing Lotion  Keri Anti-Bacterial Hand Lotion   Keri Deep Conditioning Original Lotion Dry Skin Formula Softly Scented  Keri Deep Conditioning Original Lotion, Fragrance Free Sensitive Skin Formula  Keri Lotion Fast Absorbing Fragrance Free Sensitive Skin Formula  Keri Lotion Fast Absorbing Softly Scented Dry Skin Formula  Keri Original Lotion  Keri Skin Renewal Lotion Keri Silky Smooth Lotion  Keri Silky Smooth Sensitive Skin Lotion  Nivea Body Creamy Conditioning Oil  Nivea Body Extra Enriched Teacher, adult education Moisturizing Lotion Nivea Crme  Nivea Skin Firming Lotion  NutraDerm 30 Skin Lotion  NutraDerm Skin Lotion  NutraDerm Therapeutic Skin Cream  NutraDerm Therapeutic Skin Lotion  ProShield Protective Hand Cream  Provon moisturizing lotion

## 2022-09-25 ENCOUNTER — Ambulatory Visit: Payer: Medicare Other

## 2022-09-25 ENCOUNTER — Other Ambulatory Visit: Payer: Self-pay

## 2022-09-25 ENCOUNTER — Ambulatory Visit: Payer: Medicare Other | Admitting: Urgent Care

## 2022-09-25 ENCOUNTER — Ambulatory Visit
Admission: RE | Admit: 2022-09-25 | Discharge: 2022-09-25 | Disposition: A | Discharge status: Home / Self Care | Payer: Medicare Other | Attending: Neurosurgery | Admitting: Neurosurgery

## 2022-09-25 ENCOUNTER — Encounter
Admission: RE | Disposition: A | Discharge status: Home / Self Care | Payer: Self-pay | Source: Home / Self Care | Attending: Neurosurgery

## 2022-09-25 ENCOUNTER — Encounter: Payer: Self-pay | Admitting: Neurosurgery

## 2022-09-25 DIAGNOSIS — Z833 Family history of diabetes mellitus: Secondary | ICD-10-CM | POA: Diagnosis not present

## 2022-09-25 DIAGNOSIS — L4050 Arthropathic psoriasis, unspecified: Secondary | ICD-10-CM | POA: Insufficient documentation

## 2022-09-25 DIAGNOSIS — K219 Gastro-esophageal reflux disease without esophagitis: Secondary | ICD-10-CM | POA: Insufficient documentation

## 2022-09-25 DIAGNOSIS — D649 Anemia, unspecified: Secondary | ICD-10-CM | POA: Diagnosis not present

## 2022-09-25 DIAGNOSIS — Z791 Long term (current) use of non-steroidal anti-inflammatories (NSAID): Secondary | ICD-10-CM | POA: Diagnosis not present

## 2022-09-25 DIAGNOSIS — G959 Disease of spinal cord, unspecified: Secondary | ICD-10-CM

## 2022-09-25 DIAGNOSIS — R519 Headache, unspecified: Secondary | ICD-10-CM | POA: Diagnosis not present

## 2022-09-25 DIAGNOSIS — Z01812 Encounter for preprocedural laboratory examination: Secondary | ICD-10-CM

## 2022-09-25 DIAGNOSIS — F32A Depression, unspecified: Secondary | ICD-10-CM | POA: Insufficient documentation

## 2022-09-25 DIAGNOSIS — F419 Anxiety disorder, unspecified: Secondary | ICD-10-CM | POA: Insufficient documentation

## 2022-09-25 DIAGNOSIS — M2578 Osteophyte, vertebrae: Secondary | ICD-10-CM | POA: Insufficient documentation

## 2022-09-25 DIAGNOSIS — I10 Essential (primary) hypertension: Secondary | ICD-10-CM | POA: Diagnosis not present

## 2022-09-25 DIAGNOSIS — M1990 Unspecified osteoarthritis, unspecified site: Secondary | ICD-10-CM | POA: Diagnosis not present

## 2022-09-25 DIAGNOSIS — G992 Myelopathy in diseases classified elsewhere: Secondary | ICD-10-CM | POA: Insufficient documentation

## 2022-09-25 DIAGNOSIS — E119 Type 2 diabetes mellitus without complications: Secondary | ICD-10-CM | POA: Diagnosis not present

## 2022-09-25 DIAGNOSIS — Z01818 Encounter for other preprocedural examination: Secondary | ICD-10-CM

## 2022-09-25 DIAGNOSIS — M4802 Spinal stenosis, cervical region: Secondary | ICD-10-CM

## 2022-09-25 HISTORY — PX: ANTERIOR CERVICAL DECOMP/DISCECTOMY FUSION: SHX1161

## 2022-09-25 LAB — GLUCOSE, CAPILLARY
Glucose-Capillary: 150 mg/dL — ABNORMAL HIGH (ref 70–99)
Glucose-Capillary: 132 mg/dL — ABNORMAL HIGH (ref 70–99)

## 2022-09-25 SURGERY — ANTERIOR CERVICAL DECOMPRESSION/DISCECTOMY FUSION 2 LEVELS
Anesthesia: General | Site: Spine Cervical

## 2022-09-25 MED ORDER — HYDROMORPHONE HCL 2 MG PO TABS
ORAL_TABLET | ORAL | Status: AC
  Filled 2022-09-25: qty 1

## 2022-09-25 MED ORDER — DEXAMETHASONE SODIUM PHOSPHATE 10 MG/ML IJ SOLN
INTRAMUSCULAR | Status: DC | PRN
  Administered 2022-09-25: 10 mg via INTRAVENOUS

## 2022-09-25 MED ORDER — ROCURONIUM BROMIDE 10 MG/ML (PF) SYRINGE
PREFILLED_SYRINGE | INTRAVENOUS | Status: AC
  Filled 2022-09-25: qty 10

## 2022-09-25 MED ORDER — HYDROMORPHONE HCL 2 MG PO TABS
2.0000 mg | ORAL_TABLET | Freq: Once | ORAL | Status: AC | PRN
  Administered 2022-09-25: 2 mg via ORAL

## 2022-09-25 MED ORDER — 0.9 % SODIUM CHLORIDE (POUR BTL) OPTIME
TOPICAL | Status: DC | PRN
  Administered 2022-09-25: 500 mL

## 2022-09-25 MED ORDER — BUPIVACAINE-EPINEPHRINE (PF) 0.5% -1:200000 IJ SOLN
INTRAMUSCULAR | Status: DC | PRN
  Administered 2022-09-25: 3 mL via PERINEURAL

## 2022-09-25 MED ORDER — ACETAMINOPHEN 10 MG/ML IV SOLN: 1000.0000 mg | Freq: Once | INTRAVENOUS | Status: DC | PRN

## 2022-09-25 MED ORDER — REMIFENTANIL HCL 1 MG IV SOLR
INTRAVENOUS | Status: AC
  Filled 2022-09-25: qty 1000

## 2022-09-25 MED ORDER — EPHEDRINE SULFATE (PRESSORS) 50 MG/ML IJ SOLN
INTRAMUSCULAR | Status: DC | PRN
  Administered 2022-09-25: 10 mg via INTRAVENOUS

## 2022-09-25 MED ORDER — PHENYLEPHRINE 80 MCG/ML (10ML) SYRINGE FOR IV PUSH (FOR BLOOD PRESSURE SUPPORT)
PREFILLED_SYRINGE | INTRAVENOUS | Status: AC
  Filled 2022-09-25: qty 10

## 2022-09-25 MED ORDER — SODIUM CHLORIDE 0.9 % IV SOLN: INTRAVENOUS | Status: DC

## 2022-09-25 MED ORDER — REMIFENTANIL HCL 1 MG IV SOLR
INTRAVENOUS | Status: DC | PRN
  Administered 2022-09-25: .05 ug/kg/min via INTRAVENOUS

## 2022-09-25 MED ORDER — ONDANSETRON HCL 4 MG/2ML IJ SOLN: 4.0000 mg | Freq: Once | INTRAMUSCULAR | Status: DC | PRN

## 2022-09-25 MED ORDER — PHENYLEPHRINE HCL-NACL 20-0.9 MG/250ML-% IV SOLN
INTRAVENOUS | Status: DC | PRN
  Administered 2022-09-25: 40 ug/min via INTRAVENOUS

## 2022-09-25 MED ORDER — CEFAZOLIN SODIUM-DEXTROSE 2-4 GM/100ML-% IV SOLN
2.0000 g | Freq: Once | INTRAVENOUS | Status: AC
  Administered 2022-09-25: 2 g via INTRAVENOUS

## 2022-09-25 MED ORDER — HYDROMORPHONE HCL 2 MG PO TABS: 2.0000 mg | ORAL_TABLET | Freq: Once | ORAL | 0 refills | Status: DC | PRN

## 2022-09-25 MED ORDER — SUCCINYLCHOLINE CHLORIDE 200 MG/10ML IV SOSY
PREFILLED_SYRINGE | INTRAVENOUS | Status: AC
  Filled 2022-09-25: qty 10

## 2022-09-25 MED ORDER — METHOCARBAMOL 500 MG PO TABS: 500.0000 mg | ORAL_TABLET | Freq: Four times a day (QID) | ORAL | 0 refills | Status: DC | PRN

## 2022-09-25 MED ORDER — ACETAMINOPHEN 10 MG/ML IV SOLN
INTRAVENOUS | Status: AC
  Filled 2022-09-25: qty 100

## 2022-09-25 MED ORDER — FENTANYL CITRATE (PF) 100 MCG/2ML IJ SOLN
INTRAMUSCULAR | Status: AC
  Filled 2022-09-25: qty 2

## 2022-09-25 MED ORDER — PROPOFOL 10 MG/ML IV BOLUS
INTRAVENOUS | Status: AC
  Filled 2022-09-25: qty 40

## 2022-09-25 MED ORDER — HYDROMORPHONE HCL 1 MG/ML IJ SOLN
INTRAMUSCULAR | Status: AC
  Filled 2022-09-25: qty 1

## 2022-09-25 MED ORDER — DEXAMETHASONE SODIUM PHOSPHATE 10 MG/ML IJ SOLN
INTRAMUSCULAR | Status: AC
  Filled 2022-09-25: qty 1

## 2022-09-25 MED ORDER — EPHEDRINE 5 MG/ML INJ
INTRAVENOUS | Status: AC
  Filled 2022-09-25: qty 5

## 2022-09-25 MED ORDER — BUPIVACAINE-EPINEPHRINE (PF) 0.5% -1:200000 IJ SOLN
INTRAMUSCULAR | Status: AC
  Filled 2022-09-25: qty 20

## 2022-09-25 MED ORDER — CHLORHEXIDINE GLUCONATE 0.12 % MT SOLN
OROMUCOSAL | Status: AC
  Filled 2022-09-25: qty 15

## 2022-09-25 MED ORDER — PROPOFOL 1000 MG/100ML IV EMUL
INTRAVENOUS | Status: AC
  Filled 2022-09-25: qty 100

## 2022-09-25 MED ORDER — SUCCINYLCHOLINE CHLORIDE 200 MG/10ML IV SOSY
PREFILLED_SYRINGE | INTRAVENOUS | Status: DC | PRN
  Administered 2022-09-25: 100 mg via INTRAVENOUS

## 2022-09-25 MED ORDER — ONDANSETRON HCL 4 MG/2ML IJ SOLN
INTRAMUSCULAR | Status: AC
  Filled 2022-09-25: qty 2

## 2022-09-25 MED ORDER — SENNA 8.6 MG PO TABS: 1.0000 | ORAL_TABLET | Freq: Every day | ORAL | 0 refills | Status: DC | PRN

## 2022-09-25 MED ORDER — LIDOCAINE HCL (CARDIAC) PF 100 MG/5ML IV SOSY
PREFILLED_SYRINGE | INTRAVENOUS | Status: DC | PRN
  Administered 2022-09-25: 80 mg via INTRAVENOUS

## 2022-09-25 MED ORDER — CHLORHEXIDINE GLUCONATE 0.12 % MT SOLN: 15.0000 mL | Freq: Once | OROMUCOSAL | Status: DC

## 2022-09-25 MED ORDER — GLYCOPYRROLATE 0.2 MG/ML IJ SOLN
INTRAMUSCULAR | Status: AC
  Filled 2022-09-25: qty 1

## 2022-09-25 MED ORDER — PROPOFOL 500 MG/50ML IV EMUL
INTRAVENOUS | Status: DC | PRN
  Administered 2022-09-25: 50 ug/kg/min via INTRAVENOUS

## 2022-09-25 MED ORDER — FENTANYL CITRATE (PF) 100 MCG/2ML IJ SOLN
25.0000 ug | INTRAMUSCULAR | Status: DC | PRN
  Administered 2022-09-25 (×4): 25 ug via INTRAVENOUS

## 2022-09-25 MED ORDER — CEFAZOLIN SODIUM-DEXTROSE 2-4 GM/100ML-% IV SOLN
INTRAVENOUS | Status: AC
  Filled 2022-09-25: qty 100

## 2022-09-25 MED ORDER — ONDANSETRON HCL 4 MG/2ML IJ SOLN
INTRAMUSCULAR | Status: DC | PRN
  Administered 2022-09-25: 4 mg via INTRAVENOUS

## 2022-09-25 MED ORDER — LIDOCAINE HCL (PF) 2 % IJ SOLN
INTRAMUSCULAR | Status: AC
  Filled 2022-09-25: qty 5

## 2022-09-25 MED ORDER — HYDROMORPHONE HCL 2 MG PO TABS: 2.0000 mg | ORAL_TABLET | Freq: Four times a day (QID) | ORAL | 0 refills | Status: AC | PRN

## 2022-09-25 MED ORDER — PROPOFOL 10 MG/ML IV BOLUS
INTRAVENOUS | Status: DC | PRN
  Administered 2022-09-25: 150 mg via INTRAVENOUS

## 2022-09-25 MED ORDER — METHOCARBAMOL 500 MG PO TABS
ORAL_TABLET | ORAL | Status: AC
  Filled 2022-09-25: qty 1

## 2022-09-25 MED ORDER — HYDROMORPHONE HCL 2 MG PO TABS: 1.0000 mg | ORAL_TABLET | Freq: Once | ORAL | Status: DC | PRN

## 2022-09-25 MED ORDER — ACETAMINOPHEN 10 MG/ML IV SOLN
INTRAVENOUS | Status: DC | PRN
  Administered 2022-09-25: 1000 mg via INTRAVENOUS

## 2022-09-25 MED ORDER — ORAL CARE MOUTH RINSE: 15.0000 mL | Freq: Once | OROMUCOSAL | Status: DC

## 2022-09-25 MED ORDER — SENNA 8.6 MG PO TABS: 1.0000 | ORAL_TABLET | Freq: Every day | ORAL | 0 refills | Status: AC | PRN

## 2022-09-25 MED ORDER — METHOCARBAMOL 500 MG PO TABS
500.0000 mg | ORAL_TABLET | ORAL | Status: AC
  Administered 2022-09-25: 500 mg via ORAL

## 2022-09-25 MED ORDER — FENTANYL CITRATE (PF) 100 MCG/2ML IJ SOLN
INTRAMUSCULAR | Status: DC | PRN
  Administered 2022-09-25 (×4): 25 ug via INTRAVENOUS

## 2022-09-25 MED ORDER — HYDROMORPHONE HCL 1 MG/ML IJ SOLN
INTRAMUSCULAR | Status: DC | PRN
  Administered 2022-09-25 (×3): .2 mg via INTRAVENOUS

## 2022-09-25 MED ORDER — ONDANSETRON HCL 4 MG/2ML IJ SOLN: INTRAMUSCULAR | Status: DC | PRN

## 2022-09-25 MED ORDER — LACTATED RINGERS IV SOLN: INTRAVENOUS | Status: DC | PRN

## 2022-09-25 MED ORDER — LACTATED RINGERS IV SOLN: INTRAVENOUS | Status: DC

## 2022-09-25 SURGICAL SUPPLY — 49 items
ADH SKN CLS APL DERMABOND .7 (GAUZE/BANDAGES/DRESSINGS) ×1
AGENT HMST KT MTR STRL THRMB (HEMOSTASIS) ×1
ALLOGRAFT BONE FIBER KORE 1CC (Bone Implant) IMPLANT
BASIN KIT SINGLE STR (MISCELLANEOUS) ×1 IMPLANT
BULB RESERV EVAC DRAIN JP 100C (MISCELLANEOUS) IMPLANT
BUR NEURO DRILL SOFT 3.0X3.8M (BURR) ×1 IMPLANT
DERMABOND ADVANCED .7 DNX12 (GAUZE/BANDAGES/DRESSINGS) ×1 IMPLANT
DRAIN CHANNEL JP 10F RND 20C F (MISCELLANEOUS) IMPLANT
DRAPE C ARM PK CFD 31 SPINE (DRAPES) ×1 IMPLANT
DRAPE LAPAROTOMY 77X122 PED (DRAPES) ×1 IMPLANT
DRAPE MICROSCOPE SPINE 48X150 (DRAPES) ×1 IMPLANT
ELECT REM PT RETURN 9FT ADLT (ELECTROSURGICAL) ×1
ELECTRODE REM PT RTRN 9FT ADLT (ELECTROSURGICAL) ×1 IMPLANT
FEE INTRAOP CADWELL SUPPLY NCS (MISCELLANEOUS) IMPLANT
FEE INTRAOP MONITOR IMPULS NCS (MISCELLANEOUS) IMPLANT
GLOVE BIOGEL PI IND STRL 6.5 (GLOVE) ×1 IMPLANT
GLOVE SURG SYN 6.5 ES PF (GLOVE) ×1 IMPLANT
GLOVE SURG SYN 6.5 PF PI (GLOVE) ×1 IMPLANT
GLOVE SURG SYN 8.5 E (GLOVE) ×3 IMPLANT
GLOVE SURG SYN 8.5 PF PI (GLOVE) ×3 IMPLANT
GOWN SRG LRG LVL 4 IMPRV REINF (GOWNS) ×1 IMPLANT
GOWN SRG XL LVL 3 NONREINFORCE (GOWNS) ×1 IMPLANT
GOWN STRL NON-REIN TWL XL LVL3 (GOWNS) ×1
GOWN STRL REIN LRG LVL4 (GOWNS) ×1
INTRAOP CADWELL SUPPLY FEE NCS (MISCELLANEOUS) ×1
INTRAOP DISP SUPPLY FEE NCS (MISCELLANEOUS) ×1
INTRAOP MONITOR FEE IMPULS NCS (MISCELLANEOUS) ×1
KIT TURNOVER KIT A (KITS) ×1 IMPLANT
MANIFOLD NEPTUNE II (INSTRUMENTS) ×1 IMPLANT
NDL SAFETY ECLIP 18X1.5 (MISCELLANEOUS) ×1 IMPLANT
NS IRRIG 1000ML POUR BTL (IV SOLUTION) ×1 IMPLANT
NS IRRIG 500ML POUR BTL (IV SOLUTION) IMPLANT
PACK LAMINECTOMY ARMC (PACKS) ×1 IMPLANT
PAD ARMBOARD 7.5X6 YLW CONV (MISCELLANEOUS) ×2 IMPLANT
PIN CASPAR 14 (PIN) ×1 IMPLANT
PIN CASPAR 14MM (PIN) ×1
PLATE ACP 1.6X36 2LVL (Plate) IMPLANT
SCREW ACP 3.5X17 S/D VARIA (Screw) IMPLANT
SPACER C HEDRON 12X14 6 7D (Spacer) IMPLANT
SPONGE KITTNER 5P (MISCELLANEOUS) ×1 IMPLANT
STAPLER SKIN PROX 35W (STAPLE) IMPLANT
SURGIFLO W/THROMBIN 8M KIT (HEMOSTASIS) ×1 IMPLANT
SUT DVC VLOC 3-0 CL 6 P-12 (SUTURE) IMPLANT
SUT VIC AB 3-0 SH 8-18 (SUTURE) ×1 IMPLANT
SYR 20ML LL LF (SYRINGE) ×1 IMPLANT
TAPE CLOTH 3X10 WHT NS LF (GAUZE/BANDAGES/DRESSINGS) ×3 IMPLANT
TRAP FLUID SMOKE EVACUATOR (MISCELLANEOUS) ×1 IMPLANT
TRAY FOLEY SLVR 16FR LF STAT (SET/KITS/TRAYS/PACK) IMPLANT
WATER STERILE IRR 1000ML POUR (IV SOLUTION) ×2 IMPLANT

## 2022-09-25 NOTE — Discharge Summary (Signed)
Discharge Summary  Patient ID: Tina Hubbard MRN: [id number] DOB/AGE: 1945-12-30 77 y.o.  Admit date: 09/25/2022 Discharge date: 09/25/2022  Admission Diagnoses: Cervical myelopathy Discharge Diagnoses:  Active Problems:   Cervical myelopathy (HCC)   Cervical spinal stenosis   Discharged Condition: good  Hospital Course:  Tameki Niess is a 78 year old presenting with symptoms concerning for cervical myelopathy status post C4-6 ACDF.  Her intraoperative course was uncomplicated.  She was monitored in PACU for 4 hours and discharged home after ambulating, urinating, and tolerating p.o. intake.  She was given medications for pain, muscle relaxant and stool softener.  She will follow-up in 2 weeks for regular scheduled postop visit or sooner should she have any questions or concerns.  Consults: None  Significant Diagnostic Studies: none  Treatments: surgery: As above.  Please see separately dictated operative report for further details.  Discharge Exam: Blood pressure (!) 162/83, pulse 78, temperature 98.5 F (36.9 C), temperature source Temporal, resp. rate 18, height 5' 3.5" (1.613 m), weight 72.8 kg, SpO2 94 %. CN II-XII grossly intact MAEW Incision covered with dermabond  Disposition: Discharge disposition: 01-Home or Self Care        Allergies as of 09/25/2022       Reactions   Hydrocodone Nausea And Vomiting   Oxycodone Other (See Comments)   Caused jitteriness and crying spells        Medication List     STOP taking these medications    Enbrel SureClick 50 MG/ML injection Generic drug: etanercept   meloxicam 15 MG tablet Commonly known as: MOBIC       TAKE these medications    amLODipine 5 MG tablet Commonly known as: NORVASC Take 5 mg by mouth every morning.   atorvastatin 20 MG tablet Commonly known as: LIPITOR Take 20 mg by mouth every morning.   busPIRone 10 MG tablet Commonly known as: BUSPAR Take 10 mg by mouth 2 (two) times daily.    celecoxib 200 MG capsule Commonly known as: CELEBREX Take 200 mg by mouth 2 (two) times daily.   docusate sodium 100 MG capsule Commonly known as: COLACE Take 1 capsule (100 mg total) by mouth 2 (two) times daily. To keep stools soft What changed:  when to take this reasons to take this   DULoxetine 60 MG capsule Commonly known as: CYMBALTA Take 60 mg by mouth every morning.   estradiol 1 MG tablet Commonly known as: ESTRACE Take 1 mg by mouth every morning.   gabapentin 300 MG capsule Commonly known as: NEURONTIN Take 300-600 mg by mouth See admin instructions. Take 300 mg by mouth in the morning, and 600 mg at bedtime   HYDROmorphone 2 MG tablet Commonly known as: Dilaudid Take 1 tablet (2 mg total) by mouth every 6 (six) hours as needed for up to 5 days for severe pain. What changed: when to take this   HYDROmorphone 2 MG tablet Commonly known as: DILAUDID Take 1 tablet (2 mg total) by mouth once as needed for severe pain. What changed: You were already taking a medication with the same name, and this prescription was added. Make sure you understand how and when to take each.   methocarbamol 500 MG tablet Commonly known as: ROBAXIN Take 1 tablet (500 mg total) by mouth every 6 (six) hours as needed for muscle spasms. What changed: when to take this   omeprazole 20 MG capsule Commonly known as: PRILOSEC Take 20 mg by mouth every morning.   senna 8.6 MG  Tabs tablet Commonly known as: SENOKOT Take 1 tablet (8.6 mg total) by mouth daily as needed.   traZODone 50 MG tablet Commonly known as: DESYREL Take 50 mg by mouth at bedtime.   valsartan-hydrochlorothiazide 320-12.5 MG tablet Commonly known as: DIOVAN-HCT Take 1 tablet by mouth every morning.         Signed: Susanne Borders 09/25/2022, 2:16 PM

## 2022-09-25 NOTE — Transfer of Care (Signed)
Immediate Anesthesia Transfer of Care Note  Patient: Tina Hubbard  Procedure(s) Performed: C4-6 ANTERIOR CERVICAL DISCECTOMY AND FUSION (HEDRON) (Spine Cervical)  Patient Location: PACU  Anesthesia Type:General  Level of Consciousness: drowsy and patient cooperative  Airway & Oxygen Therapy: Patient Spontanous Breathing and Patient connected to face mask oxygen  Post-op Assessment: Report given to RN and Post -op Vital signs reviewed and stable  Post vital signs: Reviewed and stable  Last Vitals:  Vitals Value Taken Time  BP 155/77 09/25/22 0948  Temp 36.4 C 09/25/22 0948  Pulse 94 09/25/22 0952  Resp 16 09/25/22 0952  SpO2 100 % 09/25/22 0952  Vitals shown include unvalidated device data.  Last Pain:  Vitals:   09/25/22 0630  TempSrc: Oral  PainSc: 3          Complications: No notable events documented.

## 2022-09-25 NOTE — Progress Notes (Signed)
Patient verbalizes understanding of all discharge instructions. Noted scant amount oozing from anterior neck incision, 2x2 placed over, instructed patient to monitor, if any issues/concerns please call Dr. Lucienne Capers office, patient vebalizes understanding.

## 2022-09-25 NOTE — Progress Notes (Signed)
   09/25/22 1100  Spiritual Encounters  Type of Visit Initial  Care provided to: Patient  Referral source Chaplain assessment  Reason for visit Routine spiritual support  OnCall Visit No  Spiritual Framework  Presenting Themes Courage hope and growth  Patient Stress Factors None identified  Family Stress Factors None identified  Interventions  Spiritual Care Interventions Made Established relationship of care and support;Compassionate presence  Intervention Outcomes  Outcomes Awareness of support  Spiritual Care Plan  Spiritual Care Issues Still Outstanding No further spiritual care needs at this time (see row info)   Chaplain visited with patient prior to procedure.  Pleasant mood and affect. Chaplain spiritual support services available as need arises.

## 2022-09-25 NOTE — Anesthesia Preprocedure Evaluation (Addendum)
Anesthesia Evaluation  Patient identified by MRN, date of birth, ID band Patient awake    Reviewed: Allergy & Precautions, NPO status , Patient's Chart, lab work & pertinent test results  History of Anesthesia Complications Negative for: history of anesthetic complications  Airway Mallampati: III   Neck ROM: Full    Dental  (+) Missing   Pulmonary neg pulmonary ROS   Pulmonary exam normal breath sounds clear to auscultation       Cardiovascular Exercise Tolerance: Good hypertension, Normal cardiovascular exam Rhythm:Regular Rate:Normal  ECG 05/05/22: NSR, no change from prior   Neuro/Psych  Headaches PSYCHIATRIC DISORDERS Anxiety Depression     Neuromuscular disease (ulnar neuropathy)    GI/Hepatic ,GERD  Medicated and Controlled,,  Endo/Other  diabetes, Type 2    Renal/GU negative Renal ROS     Musculoskeletal  (+) Arthritis ,    Abdominal   Peds  Hematology  (+) Blood dyscrasia, anemia   Anesthesia Other Findings   Reproductive/Obstetrics                             Anesthesia Physical Anesthesia Plan  ASA: 2  Anesthesia Plan: General   Post-op Pain Management:    Induction: Intravenous  PONV Risk Score and Plan: 3 and Ondansetron, Dexamethasone and Treatment may vary due to age or medical condition  Airway Management Planned: Oral ETT  Additional Equipment:   Intra-op Plan:   Post-operative Plan: Extubation in OR  Informed Consent: I have reviewed the patients History and Physical, chart, labs and discussed the procedure including the risks, benefits and alternatives for the proposed anesthesia with the patient or authorized representative who has indicated his/her understanding and acceptance.     Dental advisory given  Plan Discussed with: CRNA  Anesthesia Plan Comments: (Patient consented for risks of anesthesia including but not limited to:  - adverse reactions  to medications - damage to eyes, teeth, lips or other oral mucosa - nerve damage due to positioning  - sore throat or hoarseness - damage to heart, brain, nerves, lungs, other parts of body or loss of life  Informed patient about role of CRNA in peri- and intra-operative care.  Patient voiced understanding.)        Anesthesia Quick Evaluation

## 2022-09-25 NOTE — H&P (Signed)
Referring Physician:  No referring provider defined for this encounter.  Primary Physician:  Marisue Ivan, MD  History of Present Illness: 09/25/2022 Tina Hubbard presents with ongoing symptoms as noted below.  08/08/2022 [name] is here today with a chief complaint of neck pain, imbalance, numbness and tingling in her hands, loss of grip strength.  She is also describing second pain which extends in the left side of her scalp.  She has been dropping things and having balance issues over the last 6 to 7 months.  She has pain into her left trapezius.  Her pain can be as bad as 10 out of 10.  Lifting and lying in bed make it worse.  Her pain is constant.  Bowel/Bladder Dysfunction: none  Conservative measures:  Physical therapy:  denies has not participated in  Multimodal medical therapy including regular antiinflammatories:  ibuprofen, gabapentin, celebrex  Injections: denies has not received epidural steroid injections for her neck?  Past Surgery:  12/27/11: Lumbar Laminectomy by Dr. Rise Mu 11/20/14: L4-5 TLIF by Dr. Christene Lye  09/04/18: L3-4 XLIF, L3-S1 PSF   Tina Hubbard has symptoms of cervical myelopathy.  The symptoms are causing a significant impact on the patient's life.   I have utilized the care everywhere function in epic to review the outside records available from external health systems.  Review of Systems:  A 10 point review of systems is negative, except for the pertinent positives and negatives detailed in the HPI.  Past Medical History: Past Medical History:  Diagnosis Date   Adenoma of colon    Adnexal mass    Anxiety    Arthritis    Psoriatic   Cystoid macular edema of right eye 07/16/2019   DDD (degenerative disc disease), lumbar    Depression    Detached retina    has a sown in oil bubble in eye since Jan 17th   DM (diabetes mellitus), type 2 (HCC)    last a1c 7.0 01-19-22-diet controlled no meds   GERD (gastroesophageal reflux  disease)    Headache    Hyperlipidemia    Hypertension    Left leg pain    occasional numbness, R/T "slipped disc" above fusion   Osteoarthritis    Psoriatic arthritis (HCC)    Right posterior capsular opacification 07/16/2019    Past Surgical History: Past Surgical History:  Procedure Laterality Date   ABDOMINAL HYSTERECTOMY     ANGIOPLASTY     APPENDECTOMY     BACK SURGERY  2015   SPINAL FUSION [facility]   BILATERAL CARPAL TUNNEL RELEASE Bilateral    BREAST BIOPSY Left yrs ago   EXCISIONAL X 2 - NEG   BREAST BIOPSY Right 02/10/2020   [place] Bx, vision clip, neg   BREAST BIOPSY Right 08/24/2020   Stereo bx, x xlip, path pending   CARPAL TUNNEL RELEASE Left 02/27/2018   Procedure: CARPAL TUNNEL RELEASE;  Surgeon: Christena Flake, MD;  Location: [facility];  Service: Orthopedics;  Laterality: Left;   CATARACT EXTRACTION Bilateral    Dr. Vonna Kotyk   CHOLECYSTECTOMY     COLONOSCOPY     COLONOSCOPY WITH PROPOFOL N/A 01/10/2017   Procedure: COLONOSCOPY WITH PROPOFOL;  Surgeon: Scot Jun, MD;  Location: [facility];  Service: Endoscopy;  Laterality: N/A;   CYSTOSCOPY  05/10/2022   Procedure: CYSTOSCOPY;  Surgeon: Christeen Douglas, MD;  Location: [facility] OR[facility];  Service: Gynecology;;   EYE SURGERY Bilateral    Cataract Extraction with IOL  FLEXIBLE SIGMOIDOSCOPY     JOINT REPLACEMENT Bilateral 2003   TOTAL KNEE REPLACEMENTS DR CALIFF [facility]   LUMBAR LAMINECTOMY     LYSIS OF ADHESION N/A 05/10/2022   Procedure: LYSIS OF ADHESION;  Surgeon: Christeen Douglas, MD;  Location: [facility];  Service: Gynecology;  Laterality: N/A;   POSTERIOR LUMBAR FUSION 4 LEVEL N/A 09/04/2018   Procedure: L3-4 XLIF, L3-S1 PSF (MIS);  Surgeon: Venetia Night, MD;  Location: [facility];  Service: Neurosurgery;  Laterality: N/A;   RETINAL DETACHMENT SURGERY     ROBOTIC ASSISTED BILATERAL SALPINGO OOPHERECTOMY Bilateral 05/10/2022   Procedure: XI ROBOTIC ASSISTED BILATERAL SALPINGO  OOPHORECTOMY with PELVIC WASHINGS; LAPAROSCOPIC LYSIS OF ADHESION;  Surgeon: Christeen Douglas, MD;  Location: [facility];  Service: Gynecology;  Laterality: Bilateral;  RNFA   SHOULDER ARTHROSCOPY WITH OPEN ROTATOR CUFF REPAIR Right 09/19/2016   Procedure: SHOULDER ARTHROSCOPY WITH MINI OPEN ROTATOR CUFF REPAIR;  Surgeon: Christena Flake, MD;  Location: [facility];  Service: Orthopedics;  Laterality: Right;  Biceps tenolysis   TENNIS ELBOW RELEASE/NIRSCHEL PROCEDURE Left 02/27/2018   Procedure: SUBCUTANEOUS ANTERIOR TRANSPOSITION OF TH EULNAR NERVE AT THE ELBOW;  Surgeon: Christena Flake, MD;  Location: [facility];  Service: Orthopedics;  Laterality: Left;   TRIGGER FINGER RELEASE     ULNAR NERVE TRANSPOSITION Left 02/21/2017   Procedure: ULNAR NERVE DECOMPRESSION WRIST GUYON'S CANAL;  Surgeon: Christena Flake, MD;  Location: [facility];  Service: Orthopedics;  Laterality: Left;    Allergies: Allergies as of 08/28/2022 - Review Complete 08/08/2022  Allergen Reaction Noted   Hydrocodone Nausea And Vomiting 02/13/2017   Oxycodone Other (See Comments) 12/21/2014    Medications:  Current Facility-Administered Medications:    0.9 %  sodium chloride infusion, , Intravenous, Continuous, Reed Breech, MD   ceFAZolin (ANCEF) IVPB 2g/100 mL premix, 2 g, Intravenous, Once, Venetia Night, MD   chlorhexidine (PERIDEX) 0.12 % solution 15 mL, 15 mL, Mouth/Throat, Once **OR** Oral care mouth rinse, 15 mL, Mouth Rinse, Once, Reed Breech, MD  Social History: Social History   Tobacco Use   Smoking status: Never   Smokeless tobacco: Never  Vaping Use   Vaping Use: Never used  Substance Use Topics   Alcohol use: Yes    Comment: glass of wine per month   Drug use: No    Family Medical History: Family History  Problem Relation Age of Onset   Uterine cancer Mother 61   Diabetes Mother    Diabetes Sister    Breast cancer Neg Hx     Physical Examination: Vitals:   09/25/22  0630  BP: (!) 149/81  Pulse: 75  Resp: 16  Temp: (!) 97.4 F (36.3 C)  SpO2: 96%   Heart sounds normal no MRG. Chest Clear to Auscultation Bilaterally.  General: Patient is in no apparent distress. Attention to examination is appropriate.  Neck:   Supple.  Full range of motion with discomfort on rotation to the left.  Respiratory: Patient is breathing without any difficulty.   NEUROLOGICAL:     Awake, alert, oriented to person, place, and time.  Speech is clear and fluent.   Cranial Nerves: Pupils equal round and reactive to light.  Facial tone is symmetric.  Facial sensation is symmetric. Shoulder shrug is symmetric. Tongue protrusion is midline.  There is no pronator drift.  Strength: Side Biceps Triceps Deltoid Interossei Grip Wrist Ext. Wrist Flex.  R 5 5 5 4 4 5 5   L 5 5 5 4  4  5 5   Side Iliopsoas Quads Hamstring PF DF EHL  R 5 5 5 5 5 5   L 5 5 5 5 5 5    Reflexes are 3+ and symmetric at the biceps, triceps, brachioradialis, 2+patella and achilles.   Hoffman's is present on the left.   Bilateral upper and lower extremity sensation is intact to light touch.    No evidence of dysmetria noted.  Gait is wide-based and slow.     Medical Decision Making  Imaging: MR C spine 07/15/2022 IMPRESSION: 1. 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 2. 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 3. Moderate to severe bilateral foraminal stenosis at C4-5 and on the right at C5-6. 4. Partially imaged intradural extramedullary cystic structure posterior to the thoracic spine measuring 7 mm in AP diameter with mass effect upon the thoracic cord. 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 Recommend further evaluation with MRI of the thoracic spine with and without contrast.   These results will be called to the ordering  clinician or representative by the Radiologist Assistant, and communication documented in the PACS or [employer].     Electronically Signed   By: Duanne Guess D.O.   On: 07/15/2022 11:51  I have personally reviewed the images and agree with the above interpretation.  Assessment and Plan: Ms. Bartunek is a pleasant 77 y.o. female with clinical signs and symptoms of cervical myelopathy with severe stenosis from C4-C6.  There is no role for conservative management for cervical spondylotic myelopathy. We will proceed with C4-6 anterior cervical discectomy and fusion.      Doc Mandala K. Myer Haff MD, [facility] Neurosurgery

## 2022-09-25 NOTE — Anesthesia Procedure Notes (Signed)
Procedure Name: Intubation Date/Time: 09/25/2022 7:23 AM  Performed by: Reed Breech, MDPre-anesthesia Checklist: Patient identified, Emergency Drugs available, Suction available and Patient being monitored Patient Re-evaluated:Patient Re-evaluated prior to induction Oxygen Delivery Method: Circle system utilized Preoxygenation: Pre-oxygenation with 100% oxygen Induction Type: IV induction Ventilation: Mask ventilation without difficulty Laryngoscope Size: McGraph and 3 Grade View: Grade II Tube type: Oral Tube size: 7.0 mm Number of attempts: 1 Airway Equipment and Method: Stylet and Oral airway Placement Confirmation: ETT inserted through vocal cords under direct vision, positive ETCO2 and breath sounds checked- equal and bilateral Secured at: 22 cm Tube secured with: Tape Dental Injury: Teeth and Oropharynx as per pre-operative assessment  Comments: Atraumatic intubation. Tina Hubbard, SRNA placed ETT under direct supervision. MDA and CRNA at bedside.

## 2022-09-25 NOTE — Anesthesia Postprocedure Evaluation (Signed)
Anesthesia Post Note  Patient: [name]  Procedure(s) Performed: C4-6 ANTERIOR CERVICAL DISCECTOMY AND FUSION (HEDRON) (Spine Cervical)  Patient location during evaluation: PACU Anesthesia Type: General Level of consciousness: awake and alert, oriented and patient cooperative Pain management: pain level controlled Vital Signs Assessment: post-procedure vital signs reviewed and stable Respiratory status: spontaneous breathing, nonlabored ventilation and respiratory function stable Cardiovascular status: blood pressure returned to baseline and stable Postop Assessment: adequate PO intake Anesthetic complications: no   No notable events documented.   Last Vitals:  Vitals:   09/25/22 1015 09/25/22 1030  BP: (!) 155/81 (!) 150/86  Pulse: 83 78  Resp: 11 13  Temp: 36.7 C   SpO2: 98% 100%    Last Pain:  Vitals:   09/25/22 1030  TempSrc:   PainSc: 4                  Reed Breech

## 2022-09-25 NOTE — Discharge Instructions (Signed)
Your surgeon has performed an operation on your cervical spine (neck) to relieve pressure on the spinal cord and/or nerves. This involved making an incision in the front of your neck and removing one or more of the discs that support your spine. Next, a small piece of bone, a titanium plate, and screws were used to fuse two or more of the vertebrae (bones) together.  The following are instructions to help in your recovery once you have been discharged from the hospital. Even if you feel well, it is important that you follow these activity guidelines. If you do not let your neck heal properly from the surgery, you can increase the chance of return of your symptoms and other complications.  * Do not take anti-inflammatory medications for 3 months after surgery (naproxen [Aleve], ibuprofen [Advil, Motrin], etc.). These medications can prevent your bones from healing properly.  Celebrex, if prescribed, is ok to take. Hold Enbrel as instructed pre-op  Activity    No bending, lifting, or twisting ("BLT"). Avoid lifting objects heavier than 10 pounds (gallon milk jug).  Where possible, avoid household activities that involve lifting, bending, reaching, pushing, or pulling such as laundry, vacuuming, grocery shopping, and childcare. Try to arrange for help from friends and family for these activities while your back heals.  Increase physical activity slowly as tolerated.  Taking short walks is encouraged, but avoid strenuous exercise. Do not jog, run, bicycle, lift weights, or participate in any other exercises unless specifically allowed by your doctor.  Talk to your doctor before resuming sexual activity.  You should not drive until cleared by your doctor.  Until released by your doctor, you should not return to work or school.  You should rest at home and let your body heal.   You may shower three days after your surgery.  After showering, lightly dab your incision dry. Do not take a tub bath or go  swimming until approved by your doctor at your follow-up appointment.  If your doctor ordered a cervical collar (neck brace) for you, you should wear it whenever you are out of bed. You may remove it when lying down or sleeping, but you should wear it at all other times. Not all neck surgeries require a cervical collar.  If you smoke, we strongly recommend that you quit.  Smoking has been proven to interfere with normal bone healing and will dramatically reduce the success rate of your surgery. Please contact [facility] (800-QUIT-NOW) and use the resources at www.[facility].com for assistance in stopping smoking.  Surgical Incision   If you have a dressing on your incision, you may remove it two days after your surgery. Keep your incision area clean and dry.  If you have staples or stitches on your incision, you should have a follow up scheduled for removal. If you do not have staples or stitches, you will have steri-strips (small pieces of surgical tape) or Dermabond glue. The steri-strips/glue should begin to peel away within about a week (it is fine if the steri-strips fall off before then). If the strips are still in place one week after your surgery, you may gently remove them.  Diet           You may return to your usual diet. However, you may experience discomfort when swallowing in the first month after your surgery. This is normal. You may find that softer foods are more comfortable for you to swallow. Be sure to stay hydrated.  When to Contact us  You  may experience pain in your neck and/or pain between your shoulder blades. This is normal and should improve in the next few weeks with the help of pain medication, muscle relaxers, and rest. Some patients report that a warm compress on the back of the neck or between the shoulder blades helps.  However, should you experience any of the following, contact us immediately: New numbness or weakness Pain that is progressively getting worse,  and is not relieved by your pain medication, muscle relaxers, rest, and warm compresses Bleeding, redness, swelling, pain, or drainage from surgical incision Chills or flu-like symptoms Fever greater than 101.0 F (38.3 C) Inability to eat, drink fluids, or take medications Problems with bowel or bladder functions Difficulty breathing or shortness of breath Warmth, tenderness, or swelling in your calf Contact Information How to contact us:  If you have any questions/concerns before or after surgery, you can reach [place] at [phone], or you can send a mychart message. We can be reached by phone or mychart 8am-4pm, Monday-Friday.  *Please note: Calls after 4pm are forwarded to a third party answering service. Mychart messages are not routinely monitored during evenings, weekends, and holidays. Please call our office to contact the answering service for urgent concerns during non-business hours.

## 2022-09-25 NOTE — Progress Notes (Signed)
Patient out of recliner walked to and from restroom. Voided without difficulty.

## 2022-09-25 NOTE — Op Note (Signed)
Indications: [name] is a 77 y.o. 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   Preoperative Note:     Preoperative Note:   Risks of surgery discussed include: infection, bleeding, stroke, coma, death, paralysis, CSF leak, nerve/spinal cord injury, numbness, tingling, weakness, complex regional pain syndrome, recurrent stenosis and/or disc herniation, vascular injury, development of instability, neck/back pain, need for further surgery, persistent symptoms, development of deformity, and the risks of anesthesia. The patient understood these risks and agreed to proceed.  Operative Note:   Procedure:  1) Anterior cervical diskectomy and fusion at C4/5 and C5/6 2) Anterior cervical instrumentation at C4 - 6 using Nuvasive ACP 3) Placement of biomechanical devices at C4/5 and C5/6  4) Use of operative microscope 5) Use of flouroscopy   Procedure: After obtaining informed consent, the patient taken to the operating room, placed in supine position, general anesthesia induced.  The patient had a small shoulder roll placed behind their shoulders.  The patient received preop antibiotics and IV Decadron.  The patient had a neck incision outlined, was prepped and draped in usual sterile fashion. The incision was injected with local anesthetic.   An incision was opened, dissection taken down medial to the carotid artery and jugular vein, lateral to the trachea and esophagus.  The prevertebral fascia identified and a localizing x-ray demonstrated the correct level.  The longus colli were dissected laterally, and self-retaining  retractors placed to open the operative field. The microscope was then brought into the field.  With this complete, distractor pins were placed in the vertebral bodies of C4 and C6. The distractor was placed, and the annuli at C4/5 and C5/6 were opened using a bovie.  Curettes and pituitary rongeurs used to remove the majority of C4/5 disk, then the drill was used to remove the posterior osteophyte and begin the foraminotomies. The nerve hook was used to elevate the posterior longitudinal ligament, which was then removed with Kerrison rongeurs to complete decompression of the spinal cord. The Kerrison rongeurs were then used to complete the foraminotomies bilaterally to decompress the nerve roots. The nerve hook could be passed out each foramen, ensuring decompression of the nerve roots. Meticulous hemostasis was obtained. A biomechanical device (Globus Hedron C 6 mm height x 14 mm width by 12 mm depth) was placed at C4/5. The device had been filled with demineralized bone matrix for aid in arthrodesis.  Please note that the procedure included removal of the disc, removal of the posterior osteophytes, and removal of the posterior longitudinal ligament to ensure decompression of the spinal cord.  Additionally, foraminotomies were performed on both sides of the spinal canal to decompress the nerve roots.  We then moved to the C5/6 level. Curettes and pituitary rongeurs used to remove the majority of the disk, then the drill was used to remove the posterior osteophyte and begin the foraminotomies. The nerve hook was used to elevate the posterior longitudinal ligament, which was then removed with Kerrison rongeurs to complete decompression of the spinal cord. The Kerrison rongeurs were then used to complete the foraminotomies bilaterally to decompress the nerve roots. The nerve hook could be passed out each foramen, ensuring decompression of the nerve roots.  Meticulous hemostasis was obtained. A biomechanical  device (Globus Hedron C 6 mm height x 14 mm width by 12 mm depth) was placed at C5/6. The device had been filled with demineralized bone matrix for aid in arthrodesis.  Please note that the procedure included removal of the disc, removal of the posterior osteophytes, and removal of the posterior longitudinal ligament to ensure decompression of the spinal cord.  Additionally, foraminotomies were performed on both sides of the spinal canal to decompress the nerve roots.  The caspar distractor was removed, and bone wax used for hemostasis. A separate, 36 mm Nuvasive ACP plate was chosen.  Two screws placed in each vertebral body, respectively making sure the screws were behind the locking mechanism.  Final AP and lateral radiographs were taken.   Please note that the plate is not inclusive to the biomechanical devices.  The anchoring mechanism of the plate is completely separate from the biomechanical devices.  With everything in good position, the wound was irrigated copiously and meticulous hemostasis obtained.  Wound was closed in 2 layers using interrupted inverted 3-0 Vicryl sutures in the platysma and 3-0 monocryl on the dermis.  The wound was dressed with dermabond, the head of bed at 30 degrees, taken to recovery room in stable condition.  No new postop neurological deficits were identified.  Sponge and pattie counts were correct at the end of the procedure.     I performed the entire procedure with the assistance of Manning Charity PA as an Designer, television/film set. An assistant was required for this procedure due to the complexity.  The assistant provided assistance in tissue manipulation and suction, and was required for the successful and safe performance of the procedure. I performed the critical portions of the procedure.   Venetia Night MD

## 2022-09-26 ENCOUNTER — Encounter: Payer: Self-pay | Admitting: Neurosurgery

## 2022-10-03 ENCOUNTER — Encounter: Payer: Self-pay | Admitting: Neurosurgery

## 2022-10-10 ENCOUNTER — Ambulatory Visit (INDEPENDENT_AMBULATORY_CARE_PROVIDER_SITE_OTHER): Payer: Medicare Other | Admitting: Neurosurgery

## 2022-10-10 ENCOUNTER — Encounter: Payer: Self-pay | Admitting: Neurosurgery

## 2022-10-10 VITALS — BP 128/78 | Temp 98.1°F | Ht 63.5 in | Wt 160.0 lb

## 2022-10-10 DIAGNOSIS — M4802 Spinal stenosis, cervical region: Secondary | ICD-10-CM

## 2022-10-10 DIAGNOSIS — Z09 Encounter for follow-up examination after completed treatment for conditions other than malignant neoplasm: Secondary | ICD-10-CM

## 2022-10-10 DIAGNOSIS — Z981 Arthrodesis status: Secondary | ICD-10-CM

## 2022-10-10 DIAGNOSIS — G959 Disease of spinal cord, unspecified: Secondary | ICD-10-CM

## 2022-10-10 MED ORDER — HYDROMORPHONE HCL 2 MG PO TABS: 2.0000 mg | ORAL_TABLET | Freq: Every day | ORAL | 0 refills | Status: AC | PRN

## 2022-10-10 NOTE — Progress Notes (Signed)
   REFERRING PHYSICIAN:  Marisue Ivan, Md [address] [place],  [place] [address]  DOS: 09/25/22 C4-6 ACDF  HISTORY OF PRESENT ILLNESS: ARI ENGELBRECHT is about 2 weeks status post ACDF. Overall, she is doing well previously with expected neck pain.  She has not appreciated a significant improvement in her hand function does feel like her balance has improved some.  PHYSICAL EXAMINATION:  NEUROLOGICAL:  General: In no acute distress.   Awake, alert, oriented to person, place, and time.  Pupils equal round and reactive to light.  Facial tone is symmetric.  Tongue protrusion is midline.  There is no pronator drift.  Strength: Side Biceps Triceps Deltoid Interossei Grip Wrist Ext. Wrist Flex.  R 5 5 5 5 4 5 5   L 5 5 5 5 5 5 5    Incision c/d/I and healing well   Imaging:  No interval imaging to review  Assessment / Plan: [name] is doing relatively well after ACDF. She has requested a refill of her Dilaudid which I provided.  She understands to wean off of this medication. we discussed activity escalation and I have advised the patient to lift up to 10 pounds until 6 weeks after surgery, then increase up to 25 pounds until 12 weeks after surgery.  After 12 weeks post-op, the patient advised to increase activity as tolerated. she will return to clinic in approximately 4 weeks with cervical xrays prior.  She was encouraged to call the office in the interim with any questions or concerns.    Advised to contact the office if any questions or concerns arise.   Manning Charity PA-C Dept of Neurosurgery

## 2022-10-30 ENCOUNTER — Other Ambulatory Visit: Payer: Self-pay

## 2022-10-30 DIAGNOSIS — G959 Disease of spinal cord, unspecified: Secondary | ICD-10-CM

## 2022-10-30 DIAGNOSIS — M4802 Spinal stenosis, cervical region: Secondary | ICD-10-CM

## 2022-11-07 ENCOUNTER — Ambulatory Visit
Admission: RE | Admit: 2022-11-07 | Discharge: 2022-11-07 | Disposition: A | Discharge status: Home / Self Care | Payer: Medicare Other | Attending: Neurosurgery | Admitting: Neurosurgery

## 2022-11-07 ENCOUNTER — Ambulatory Visit (INDEPENDENT_AMBULATORY_CARE_PROVIDER_SITE_OTHER): Payer: Medicare Other | Admitting: Neurosurgery

## 2022-11-07 ENCOUNTER — Encounter: Payer: Self-pay | Admitting: Neurosurgery

## 2022-11-07 ENCOUNTER — Ambulatory Visit
Admission: RE | Admit: 2022-11-07 | Discharge: 2022-11-07 | Disposition: A | Discharge status: Home / Self Care | Payer: Medicare Other | Source: Ambulatory Visit | Attending: Neurosurgery | Admitting: Neurosurgery

## 2022-11-07 VITALS — BP 142/78 | Temp 97.8°F | Ht 63.5 in | Wt 160.0 lb

## 2022-11-07 DIAGNOSIS — G959 Disease of spinal cord, unspecified: Secondary | ICD-10-CM | POA: Insufficient documentation

## 2022-11-07 DIAGNOSIS — M4802 Spinal stenosis, cervical region: Secondary | ICD-10-CM | POA: Diagnosis present

## 2022-11-07 DIAGNOSIS — Z09 Encounter for follow-up examination after completed treatment for conditions other than malignant neoplasm: Secondary | ICD-10-CM

## 2022-11-07 NOTE — Progress Notes (Signed)
   REFERRING PHYSICIAN:  Marisue Ivan, Md [address] [place],  [place] [address]  DOS: 09/25/22 C4-6 ACDF  HISTORY OF PRESENT ILLNESS: Tina Hubbard is status post ACDF. Overall, she is doing well.  Her pain is improved compared to that before surgery.    PHYSICAL EXAMINATION:  NEUROLOGICAL:  General: In no acute distress.   Awake, alert, oriented to person, place, and time.  Pupils equal round and reactive to light.  Facial tone is symmetric.  Tongue protrusion is midline.  There is no pronator drift.  Strength: Side Biceps Triceps Deltoid Interossei Grip Wrist Ext. Wrist Flex.  R 5 5 5 5  4+ 5 5  L 5 5 5 5 5 5 5    Incision c/d/I and healing well   Imaging:  No complications noted  Assessment / Plan: TORA HEMKER is doing relatively well after ACDF.   She is doing well.  Her pain is substantially improved.  Will start exercises today.  We reviewed her activity limitations.  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  We will see her back in 6 weeks.  Venetia Night MD Dept of Neurosurgery

## 2022-11-17 ENCOUNTER — Telehealth: Payer: Self-pay | Admitting: Neurosurgery

## 2022-11-17 DIAGNOSIS — Z981 Arthrodesis status: Secondary | ICD-10-CM

## 2022-11-17 DIAGNOSIS — M542 Cervicalgia: Secondary | ICD-10-CM

## 2022-11-17 MED ORDER — METHYLPREDNISOLONE 4 MG PO TBPK
ORAL_TABLET | ORAL | 0 refills | Status: DC
Start: 2022-11-17 — End: 2022-12-14

## 2022-11-17 MED ORDER — METHOCARBAMOL 500 MG PO TABS
500.0000 mg | ORAL_TABLET | Freq: Four times a day (QID) | ORAL | 0 refills | Status: DC | PRN
Start: 2022-11-17 — End: 2023-02-02

## 2022-11-17 NOTE — Addendum Note (Signed)
Addended byDrake Leach on: 11/17/2022 02:59 PM   Modules accepted: Orders

## 2022-11-17 NOTE — Telephone Encounter (Signed)
No injury. She had neck pain. No arm pain. Some numbness in left hand.   Will try medrol dose pack. She will hold celebrex while taking.   Refill sent of robaxin.   I see she is diabetic, but not on medications. Let her know the steroid can elevate her blood sugars. She should check them while taking and call PCP with any issues.

## 2022-11-17 NOTE — Telephone Encounter (Signed)
Called and spoke with patient Tina Hubbard try medrol dose pack. She Tina Hubbard hold celebrex while taking. Refill sent of robaxin.    I see she is diabetic, but not on medications. I let her know the steroid can elevate her blood sugars. She should check them while taking and call PCP with any issues. Patient understood

## 2022-11-17 NOTE — Telephone Encounter (Signed)
Called and spoke with the patient  no  injury noted.  Is not  she having any arm pain or is it just neck pain goes to the back of her head. Any numbness, tingling, in herb left hand, ring and pinky fingers   she still taking the celebrex, and neurontin. Per Drake Leach P.A she will send in a medrol dose pack and a refill for her Robaxin as she is still having pain,and advised to stop celebrex while taking Medrol Dose pak . Patient uses [facility]

## 2022-11-17 NOTE — Telephone Encounter (Signed)
Please call patient. Any injury noted? Is she having any arm pain or is it just neck pain? Any numbness, tingling, or weakness in the arms?   Is she still taking the celebrex? How about the neurontin?

## 2022-11-17 NOTE — Telephone Encounter (Signed)
C4-6 ACDF on 09/25/22/ She is calling to report extreme pain in the back of her neck up into her head. This is the 2nd day. When she saw Dr.Yarbrough she was doing really good. It huts for her to move he head sideways or down. She takes Tylenol extra strength along with her methocarbamol and this ease the pain just for a little while. She feels she needs something stronger. She has not done much of the exercises so she does not feel it's that.   [facility]

## 2022-12-05 ENCOUNTER — Other Ambulatory Visit: Payer: Self-pay | Admitting: Family Medicine

## 2022-12-05 DIAGNOSIS — G959 Disease of spinal cord, unspecified: Secondary | ICD-10-CM

## 2022-12-08 NOTE — Progress Notes (Unsigned)
   REFERRING PHYSICIAN:  Marisue Ivan, Md [address] [place],  [place] [address]  DOS: 09/25/22 C4-6 ACDF  HISTORY OF PRESENT ILLNESS: Tina Hubbard was doing well at her last visit. She called on 11/17/22 with increased pain- she was to continue her celebrex and neurontin. Medrol dose pack as called in. She had improvement with this.   She is here for follow up.   She has left sided neck pain that runs into base of her skull that is pretty constant, but still much better than it was prior to surgery. No arm pain. She notes constant numbness in left ring and small finger. History of ulnar nerve surgery with ortho years ago.   She had a fall on 12/12/22 landing on her butt and was seen in ED. She has a large bruise on her right buttock.    PHYSICAL EXAMINATION:  NEUROLOGICAL:  General: In no acute distress.   Awake, alert, oriented to person, place, and time.  Pupils equal round and reactive to light.  Facial tone is symmetric.  Tongue protrusion is midline.  There is no pronator drift.  Strength: Side Biceps Triceps Deltoid Interossei Grip Wrist Ext. Wrist Flex.  R 5 5 5 5 5 5 5   L 5 5 5 5 5 5 5    Incision well healed  Imaging:  Cervical xrays dated 12/14/22:  No complications noted   Radiology report for above xrays not yet available.   Assessment / Plan: Tina Hubbard is doing relatively well after ACDF. She continues with numbness in left small and ring finger. No weakness or arm pain.   - EMG of bilateral upper extremities to evaluate numbness left small/ring finger. History of ulnar nerve surgery with ortho. Orders to neurology at [facility].  - She can slowly return to activity as tolerated.  - Follow up in 6 months with Dr. Myer Haff with xrays.  - She will let me know when EMG is completed and will likely do phone visit to discuss results.   Drake Leach PA-C Dept of Neurosurgery

## 2022-12-12 ENCOUNTER — Emergency Department: Payer: Medicare Other

## 2022-12-12 ENCOUNTER — Other Ambulatory Visit: Payer: Self-pay

## 2022-12-12 ENCOUNTER — Emergency Department
Admission: EM | Admit: 2022-12-12 | Discharge: 2022-12-12 | Disposition: A | Discharge status: Home / Self Care | Payer: Medicare Other | Attending: Emergency Medicine | Admitting: Emergency Medicine

## 2022-12-12 DIAGNOSIS — I10 Essential (primary) hypertension: Secondary | ICD-10-CM | POA: Diagnosis not present

## 2022-12-12 DIAGNOSIS — S300XXA Contusion of lower back and pelvis, initial encounter: Secondary | ICD-10-CM | POA: Insufficient documentation

## 2022-12-12 DIAGNOSIS — S2001XA Contusion of right breast, initial encounter: Secondary | ICD-10-CM | POA: Insufficient documentation

## 2022-12-12 DIAGNOSIS — W108XXA Fall (on) (from) other stairs and steps, initial encounter: Secondary | ICD-10-CM | POA: Diagnosis not present

## 2022-12-12 DIAGNOSIS — Y92009 Unspecified place in unspecified non-institutional (private) residence as the place of occurrence of the external cause: Secondary | ICD-10-CM | POA: Insufficient documentation

## 2022-12-12 DIAGNOSIS — S3992XA Unspecified injury of lower back, initial encounter: Secondary | ICD-10-CM | POA: Diagnosis present

## 2022-12-12 MED ORDER — ACETAMINOPHEN 325 MG PO TABS
650.0000 mg | ORAL_TABLET | Freq: Once | ORAL | Status: AC
  Administered 2022-12-12: 650 mg via ORAL
  Filled 2022-12-12: qty 2

## 2022-12-12 NOTE — ED Notes (Signed)
Pt in xray and this RN called them and they will bring pt to flex 48 when they are done.

## 2022-12-12 NOTE — ED Provider Notes (Signed)
[facility] Emergency Department Provider Note     Event Date/Time   First MD Initiated Contact with Patient 12/12/22 1521     (approximate)   History   Fall   HPI  Tina Hubbard is a 77 y.o. female with a history of lumbar fusion, cervical spinal stenosis, HTN, HLD, DDD, presents to the ED for evaluation of mechanical fall.  Patient presents via EMS from home, reporting she fell down approximately 4 steps.  Primary complaint is that of pain to the right buttocks.  She denies any head injury or LOC.  She also denies any chest pain, shortness of breath, bladder or bowel incontinence, foot drop, or saddle anesthesias.  Physical Exam   Triage Vital Signs: ED Triage Vitals  Encounter Vitals Group     BP 12/12/22 1500 (!) 148/91     Systolic BP Percentile --      Diastolic BP Percentile --      Pulse Rate 12/12/22 1500 84     Resp 12/12/22 1500 18     Temp 12/12/22 1500 97.9 F (36.6 C)     Temp Source 12/12/22 1500 Oral     SpO2 12/12/22 1500 100 %     Weight 12/12/22 1457 152 lb (68.9 kg)     Height 12/12/22 1457 5\' 3"  (1.6 m)     Head Circumference --      Peak Flow --      Pain Score 12/12/22 1457 8     Pain Loc --      Pain Education --      Exclude from Growth Chart --     Most recent vital signs: Vitals:   12/12/22 1500  BP: (!) 148/91  Pulse: 84  Resp: 18  Temp: 97.9 F (36.6 C)  SpO2: 100%    General Awake, no distress. NAD HEENT NCAT. PERRL. EOMI. No rhinorrhea. Mucous membranes are moist.  CV:  Good peripheral perfusion. RRR RESP:  Normal effort. CTA ABD:  No distention.  MSK:  Normal spinal alignment without midline tenderness, spasm, deformity, or step-off.  Patient with a large soft tissue deformity to the right breast consistent with a large hematoma.  Normal active range of motion of lower extremity without difficulty.   ED Results / Procedures / Treatments   Labs (all labs ordered are listed, but only abnormal  results are displayed) Labs Reviewed - No data to display   EKG  RADIOLOGY  I personally viewed and evaluated these images as part of my medical decision making, as well as reviewing the written report by the radiologist.  ED Provider Interpretation: No acute findings  DG Right Hip w/ Pelvis  IMPRESSION: 1. No acute osseous abnormality. 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  PROCEDURES:  Critical Care performed: No  Procedures   MEDICATIONS ORDERED IN ED: Medications  acetaminophen (TYLENOL) tablet 650 mg (650 mg Oral Given 12/12/22 1507)     IMPRESSION / MDM / ASSESSMENT AND PLAN / ED COURSE  I reviewed the triage vital signs and the nursing notes.                              Differential diagnosis includes, but is not limited to, hip contusion, hip fracture, bursitis, hematoma myalgias  Patient's presentation is most consistent with acute complicated illness / injury requiring diagnostic workup.  Patient's diagnosis is consistent with fall at home resulting in a  traumatic hematoma of the right buttocks.  Patient with reassuring exam and workup overall at this time.  Radiologic evidence of any acute fracture or dislocation based on my interpretation of images.  Patient will be discharged home with instructions to take her home medications including previous prescribed muscle relaxants.  She should also apply warm compresses to help reduce symptoms.  Patient is to follow up with her PCP as discussed, as needed or otherwise directed. Patient is given ED precautions to return to the ED for any worsening or new symptoms.  FINAL CLINICAL IMPRESSION(S) / ED DIAGNOSES   Final diagnoses:  Fall in home, initial encounter  Contusion of right buttock  Traumatic hematoma of buttock, initial encounter     Rx / DC Orders   ED Discharge Orders     None        Note:  This document was prepared using Dragon voice recognition software and may include  unintentional dictation errors.    Lissa Hoard, PA-C 12/13/22 2305    Merwyn Katos, MD 12/15/22 [phone]

## 2022-12-12 NOTE — Discharge Instructions (Addendum)
Your exam and x-ray are normal and reassuring.  No evidence of large hematoma (bruise) to the buttocks.  No evidence of any acute fracture or dislocation.  Follow-up with Dr. Elbert Ewings for any ongoing concerns.  Consider increasing your muscle relaxant to twice daily as discussed.

## 2022-12-12 NOTE — ED Provider Triage Note (Signed)
Emergency Medicine Provider Triage Evaluation Note  Tina Hubbard , a 77 y.o. female  was evaluated in triage.  Pt complains of fall down the stairs. She reports pain to her right buttocks. Patient could walk after she fell. She reports her pain at 10/10. She feels like she has a large swollen area on the right buttock.   Review of Systems  Positive: Right hip pain Negative: Numbness, tingling down the right leg,  Physical Exam  Ht 5\' 3"  (1.6 m)   Wt 68.9 kg   BMI 26.93 kg/m  Gen:   Awake, no distress   Resp:  Normal effort MSK:   Moves extremities without difficulty  Other:  Hard palpable bump on patients right buttock  Medical Decision Making  Medically screening exam initiated at 2:59 PM.  Appropriate orders placed.  [name] was informed that the remainder of the evaluation will be completed by another provider, this initial triage assessment does not replace that evaluation, and the importance of remaining in the ED until their evaluation is complete.    Cameron Ali, PA-C 12/12/22 1503

## 2022-12-12 NOTE — ED Triage Notes (Signed)
Patient slipped and fell down approximately 4 steps; complaining of pain to right buttocks.

## 2022-12-14 ENCOUNTER — Encounter: Payer: Self-pay | Admitting: Orthopedic Surgery

## 2022-12-14 ENCOUNTER — Telehealth: Payer: Self-pay | Admitting: Orthopedic Surgery

## 2022-12-14 ENCOUNTER — Ambulatory Visit
Admission: RE | Admit: 2022-12-14 | Discharge: 2022-12-14 | Disposition: A | Discharge status: Home / Self Care | Payer: Medicare Other | Attending: Orthopedic Surgery | Admitting: Orthopedic Surgery

## 2022-12-14 ENCOUNTER — Ambulatory Visit (INDEPENDENT_AMBULATORY_CARE_PROVIDER_SITE_OTHER): Payer: Medicare Other | Admitting: Orthopedic Surgery

## 2022-12-14 ENCOUNTER — Ambulatory Visit
Admission: RE | Admit: 2022-12-14 | Discharge: 2022-12-14 | Disposition: A | Discharge status: Home / Self Care | Payer: Medicare Other | Source: Ambulatory Visit | Attending: Orthopedic Surgery | Admitting: Orthopedic Surgery

## 2022-12-14 VITALS — BP 122/60 | Ht 63.0 in | Wt 152.0 lb

## 2022-12-14 DIAGNOSIS — G959 Disease of spinal cord, unspecified: Secondary | ICD-10-CM | POA: Insufficient documentation

## 2022-12-14 DIAGNOSIS — Z981 Arthrodesis status: Secondary | ICD-10-CM

## 2022-12-14 DIAGNOSIS — R202 Paresthesia of skin: Secondary | ICD-10-CM

## 2022-12-14 DIAGNOSIS — R200 Anesthesia of skin: Secondary | ICD-10-CM

## 2022-12-14 NOTE — Patient Instructions (Signed)
It was so nice to see you today. Thank you so much for coming in.   You can continue the celebrex 200mg  daily as needed.   You can continue methocarbamol as needed for muscle spasms. This can make you sleepy.   I want to get an EMG (nerve conduction test) to look into things further. I have ordered this and neurology at the [facility] clinic should call you to schedule an appointment or you can call them at [phone].   Please let us know when EMG has been done and we will get results.    Drake Leach PA-C [phone]

## 2022-12-14 NOTE — Telephone Encounter (Signed)
EMG/NCS of bilateral upper extremities to be done at [facility] neurology.  Patient advised to let us know when visit was done so we can get results.

## 2022-12-18 NOTE — Telephone Encounter (Signed)
Referral faxed to [facility]

## 2022-12-29 NOTE — Telephone Encounter (Signed)
Patient said that she cancelled the appt because she has so much going on right now. She will call back to reschedule later on.

## 2022-12-29 NOTE — Telephone Encounter (Signed)
01/15/2023

## 2023-01-09 ENCOUNTER — Other Ambulatory Visit: Payer: Self-pay | Admitting: Family Medicine

## 2023-01-09 DIAGNOSIS — Z1231 Encounter for screening mammogram for malignant neoplasm of breast: Secondary | ICD-10-CM

## 2023-02-02 ENCOUNTER — Telehealth: Payer: Self-pay | Admitting: Neurosurgery

## 2023-02-02 DIAGNOSIS — M542 Cervicalgia: Secondary | ICD-10-CM

## 2023-02-02 DIAGNOSIS — Z981 Arthrodesis status: Secondary | ICD-10-CM

## 2023-02-02 MED ORDER — METHOCARBAMOL 500 MG PO TABS
500.0000 mg | ORAL_TABLET | Freq: Four times a day (QID) | ORAL | 0 refills | Status: DC | PRN
Start: 2023-02-02 — End: 2023-04-26

## 2023-02-02 NOTE — Telephone Encounter (Signed)
Patient notified

## 2023-02-02 NOTE — Telephone Encounter (Signed)
C4-6 ACDF on 09/25/22  Methocarbabol 500mg  Can she have refill? She is still having pain on the left side of her neck up into her head. The pain is more at night. She does not take it everyday just when the pain is unbearable.  [facility]

## 2023-02-02 NOTE — Telephone Encounter (Signed)
DOS: 09/25/22 C4-6 ACDF   She is taking robaxin prn. Refill okay and sent to pharmacy. Please let her know.

## 2023-02-09 ENCOUNTER — Ambulatory Visit
Admission: RE | Admit: 2023-02-09 | Discharge: 2023-02-09 | Disposition: A | Discharge status: Home / Self Care | Payer: Medicare Other | Source: Ambulatory Visit | Attending: Family Medicine | Admitting: Family Medicine

## 2023-02-09 DIAGNOSIS — Z1231 Encounter for screening mammogram for malignant neoplasm of breast: Secondary | ICD-10-CM | POA: Insufficient documentation

## 2023-04-26 ENCOUNTER — Telehealth: Payer: Self-pay | Admitting: Neurosurgery

## 2023-04-26 ENCOUNTER — Other Ambulatory Visit: Payer: Self-pay | Admitting: Neurosurgery

## 2023-04-26 DIAGNOSIS — M542 Cervicalgia: Secondary | ICD-10-CM

## 2023-04-26 DIAGNOSIS — Z981 Arthrodesis status: Secondary | ICD-10-CM

## 2023-04-26 MED ORDER — METHOCARBAMOL 500 MG PO TABS: 500.0000 mg | ORAL_TABLET | Freq: Four times a day (QID) | ORAL | 0 refills | Status: AC | PRN

## 2023-04-26 NOTE — Telephone Encounter (Signed)
C4-6 ACDF on 09/25/22  Next visit 06/12/2023 She is having constant pain in left side of the back of her neck up into her head. She is asking for a refill of the muscle relaxer methocarbamol 500mg . [facility]

## 2023-04-26 NOTE — Telephone Encounter (Signed)
Left voicemail for patient to inform her that the medication was refilled and sent to her pharmacy.

## 2023-06-11 ENCOUNTER — Other Ambulatory Visit: Payer: Self-pay | Admitting: Family Medicine

## 2023-06-11 DIAGNOSIS — G959 Disease of spinal cord, unspecified: Secondary | ICD-10-CM

## 2023-06-12 ENCOUNTER — Ambulatory Visit (INDEPENDENT_AMBULATORY_CARE_PROVIDER_SITE_OTHER): Payer: Medicare Other | Admitting: Neurosurgery

## 2023-06-12 ENCOUNTER — Ambulatory Visit
Admission: RE | Admit: 2023-06-12 | Discharge: 2023-06-12 | Disposition: A | Discharge status: Home / Self Care | Source: Ambulatory Visit | Attending: Neurosurgery | Admitting: Neurosurgery

## 2023-06-12 ENCOUNTER — Ambulatory Visit
Admission: RE | Admit: 2023-06-12 | Discharge: 2023-06-12 | Disposition: A | Discharge status: Home / Self Care | Attending: Neurosurgery | Admitting: Neurosurgery

## 2023-06-12 VITALS — BP 122/78 | Ht 63.0 in | Wt 152.0 lb

## 2023-06-12 DIAGNOSIS — M5481 Occipital neuralgia: Secondary | ICD-10-CM | POA: Diagnosis not present

## 2023-06-12 DIAGNOSIS — G959 Disease of spinal cord, unspecified: Secondary | ICD-10-CM | POA: Diagnosis present

## 2023-06-12 DIAGNOSIS — Z09 Encounter for follow-up examination after completed treatment for conditions other than malignant neoplasm: Secondary | ICD-10-CM

## 2023-06-12 NOTE — Progress Notes (Signed)
   REFERRING PHYSICIAN:  Marisue Ivan, Md [address] [place],  [place] [address]  DOS: 09/25/22 C4-6 ACDF  HISTORY OF PRESENT ILLNESS: Tina Hubbard is doing well.  She is having some continued pain in the left side of her scalp.  She has occasional aches and pains in her back.  She is taking 900 mg of gabapentin per day currently.     PHYSICAL EXAMINATION:  NEUROLOGICAL:  General: In no acute distress.   Awake, alert, oriented to person, place, and time.  Pupils equal round and reactive to light.  Facial tone is symmetric.  Tongue protrusion is midline.  There is no pronator drift.  Strength: Side Biceps Triceps Deltoid Interossei Grip Wrist Ext. Wrist Flex.  R 5 5 5 5 5 5 5   L 5 5 5 5 5 5 5    Incision well healed  Imaging:  Cervical xrays dated 12/14/22:  No complications noted   X-rays from today are stable.  Assessment / Plan: Tina Hubbard is doing relatively well after ACDF.    She has some symptoms consistent with left-sided occipital neuralgia.  She could increase her gabapentin if needed, or consider seeing Dr. Sherryll Burger at [facility] for an injection.    She is fine to continue taking ibuprofen as needed.  We will see her back on an as-needed basis.  Venetia Night MD Dept of Neurosurgery

## 2024-01-15 ENCOUNTER — Encounter: Payer: Self-pay | Admitting: Family Medicine

## 2024-01-15 DIAGNOSIS — Z1231 Encounter for screening mammogram for malignant neoplasm of breast: Secondary | ICD-10-CM

## 2024-01-16 ENCOUNTER — Other Ambulatory Visit: Payer: Self-pay | Admitting: Family Medicine

## 2024-01-16 DIAGNOSIS — N644 Mastodynia: Secondary | ICD-10-CM

## 2024-01-22 ENCOUNTER — Ambulatory Visit
Admission: RE | Admit: 2024-01-22 | Discharge: 2024-01-22 | Disposition: A | Source: Ambulatory Visit | Attending: Family Medicine | Admitting: Family Medicine

## 2024-01-22 ENCOUNTER — Ambulatory Visit
Admission: RE | Admit: 2024-01-22 | Discharge: 2024-01-22 | Disposition: A | Discharge status: Home / Self Care | Source: Ambulatory Visit | Attending: Family Medicine | Admitting: Family Medicine

## 2024-01-22 DIAGNOSIS — N644 Mastodynia: Secondary | ICD-10-CM | POA: Diagnosis present
